# Patient Record
Sex: Female | Born: 1935 | Race: White | Hispanic: No | Marital: Single
Health system: Southern US, Community
[De-identification: ages and names within clinical notes are randomized; demographics above are authoritative.]

## PROBLEM LIST (undated history)

## (undated) DIAGNOSIS — M25551 Pain in right hip: Secondary | ICD-10-CM

## (undated) DIAGNOSIS — G5600 Carpal tunnel syndrome, unspecified upper limb: Secondary | ICD-10-CM

## (undated) DIAGNOSIS — I2789 Other specified pulmonary heart diseases: Secondary | ICD-10-CM

## (undated) DIAGNOSIS — M25559 Pain in unspecified hip: Secondary | ICD-10-CM

## (undated) DIAGNOSIS — R296 Repeated falls: Secondary | ICD-10-CM

## (undated) DIAGNOSIS — M5137 Other intervertebral disc degeneration, lumbosacral region: Secondary | ICD-10-CM

## (undated) DIAGNOSIS — W19XXXA Unspecified fall, initial encounter: Secondary | ICD-10-CM

---

## 2010-02-05 LAB — CELL COUNT, BODY FLUID
FLD MONO/MACROPHAGES: 10 %
FLD NEUTROPHILS: 90 %
FLUID RBC CT.: >100 /mm3
FLUID WBC COUNT: 4147 /mm3 — ABNORMAL HIGH (ref 0–5)
TOTAL CELLS COUNTED: 10

## 2010-02-05 MED ADMIN — lidocaine (XYLOCAINE) 10 mg/mL (1 %) injection 10 mL: SUBCUTANEOUS | @ 15:00:00 | NDC 00409427601

## 2010-02-19 LAB — CULTURE, ANAEROBIC: Culture result:: NO GROWTH

## 2010-02-19 LAB — CULTURE, WOUND W GRAM STAIN: Culture result:: NO GROWTH

## 2011-07-01 ENCOUNTER — Inpatient Hospital Stay: Payer: Self-pay | Admitting: Internal Medicine

## 2011-11-03 ENCOUNTER — Encounter

## 2011-11-09 ENCOUNTER — Encounter

## 2011-11-21 LAB — CBC WITH AUTOMATED DIFF
ABS. BASOPHILS: 0 10*3/uL (ref 0.0–0.1)
ABS. EOSINOPHILS: 0.1 10*3/uL (ref 0.0–0.4)
ABS. LYMPHOCYTES: 1.2 10*3/uL (ref 0.8–3.5)
ABS. MONOCYTES: 0.6 10*3/uL (ref 0.0–1.0)
ABS. NEUTROPHILS: 5.4 10*3/uL (ref 1.8–8.0)
BASOPHILS: 0 % (ref 0–1)
EOSINOPHILS: 2 % (ref 0–7)
HCT: 43 % (ref 35.0–47.0)
HGB: 13.8 g/dL (ref 11.5–16.0)
LYMPHOCYTES: 16 % (ref 12–49)
MCH: 31.5 PG (ref 26.0–34.0)
MCHC: 32.1 g/dL (ref 30.0–36.5)
MCV: 98.2 FL (ref 80.0–99.0)
MONOCYTES: 8 % (ref 5–13)
NEUTROPHILS: 74 % (ref 32–75)
PLATELET: 261 10*3/uL (ref 150–400)
RBC: 4.38 M/uL (ref 3.80–5.20)
RDW: 14.5 % (ref 11.5–14.5)
WBC: 7.3 10*3/uL (ref 3.6–11.0)

## 2011-11-21 LAB — METABOLIC PANEL, BASIC
Anion gap: 9 mmol/L (ref 5–15)
BUN/Creatinine ratio: 17 (ref 12–20)
BUN: 24 MG/DL — ABNORMAL HIGH (ref 6–20)
CO2: 27 MMOL/L (ref 21–32)
Calcium: 8.9 MG/DL (ref 8.5–10.1)
Chloride: 103 MMOL/L (ref 97–108)
Creatinine: 1.41 MG/DL — ABNORMAL HIGH (ref 0.45–1.15)
GFR est AA: 43 mL/min/{1.73_m2} — ABNORMAL LOW (ref >60)
GFR est non-AA: 36 mL/min/{1.73_m2} — ABNORMAL LOW (ref >60)
Glucose: 106 MG/DL — ABNORMAL HIGH (ref 65–100)
Potassium: 4.8 MMOL/L (ref 3.5–5.1)
Sodium: 139 MMOL/L (ref 136–145)

## 2011-11-21 LAB — PROTHROMBIN TIME + INR
INR: 1.7 — ABNORMAL HIGH (ref 0.9–1.1)
Prothrombin time: 17.6 s — ABNORMAL HIGH (ref 9.4–11.7)

## 2011-11-21 MED ORDER — ATROPINE 0.1 MG/ML SYRINGE
0.1 mg/mL | INTRAMUSCULAR | Status: DC | PRN
Start: 2011-11-21 — End: 2011-11-21

## 2011-11-21 MED ORDER — LIDOCAINE HCL 1 % (10 MG/ML) IJ SOLN
10 mg/mL (1 %) | Freq: Once | INTRAMUSCULAR | Status: DC | PRN
Start: 2011-11-21 — End: 2011-11-21
  Administered 2011-11-21: 14:00:00 via SUBCUTANEOUS

## 2011-11-21 MED ORDER — HEPARIN (PORCINE) IN NS (PF) 1,000 UNIT/500 ML IV
1000 unit/500 mL | INTRAVENOUS | Status: DC
Start: 2011-11-21 — End: 2011-11-21

## 2011-11-21 MED ORDER — MIDAZOLAM 1 MG/ML IJ SOLN
1 mg/mL | INTRAMUSCULAR | Status: DC | PRN
Start: 2011-11-21 — End: 2011-11-21
  Administered 2011-11-21 (×3): via INTRAVENOUS

## 2011-11-21 MED ORDER — FENTANYL CITRATE (PF) 50 MCG/ML IJ SOLN
50 mcg/mL | INTRAMUSCULAR | Status: DC | PRN
Start: 2011-11-21 — End: 2011-11-21
  Administered 2011-11-21 (×4): via INTRAVENOUS

## 2011-11-21 MED ORDER — IOVERSOL 350 MG/ML IV SOLN
350 mg iodine/mL | Freq: Once | INTRAVENOUS | Status: DC | PRN
Start: 2011-11-21 — End: 2011-11-21

## 2011-11-21 MED ADMIN — 0.9% sodium chloride infusion: INTRAVENOUS | @ 14:00:00 | NDC 00409798303

## 2011-11-21 MED FILL — LIDOCAINE HCL 1 % (10 MG/ML) IJ SOLN: 10 mg/mL (1 %) | INTRAMUSCULAR | Qty: 20

## 2011-11-21 MED FILL — SODIUM CHLORIDE 0.9 % IV: INTRAVENOUS | Qty: 1000

## 2011-11-21 MED FILL — FENTANYL CITRATE (PF) 50 MCG/ML IJ SOLN: 50 mcg/mL | INTRAMUSCULAR | Qty: 2

## 2011-11-21 MED FILL — IOVERSOL 350 MG/ML IV SOLN: 350 mg iodine/mL | INTRAVENOUS | Qty: 200

## 2011-11-21 MED FILL — MIDAZOLAM 1 MG/ML IJ SOLN: 1 mg/mL | INTRAMUSCULAR | Qty: 5

## 2011-11-21 MED FILL — ATROPINE 0.1 MG/ML SYRINGE: 0.1 mg/mL | INTRAMUSCULAR | Qty: 10

## 2011-11-21 NOTE — Progress Notes (Signed)
Cardiac Cath Lab Procedure Area Arrival Note:    Jenny [name] arrived to Cardiac Cath Lab, Procedure Area. Patient identifiers verified with NAME and DATE OF BIRTH. Procedure verified with patient. Consent forms verified. Allergies verified. Patient informed of procedure and plan of care. Questions answered with review. Patient voiced understanding of procedure and plan of care.    Patient on cardiac monitor, non-invasive blood pressure, SPO2 monitor. On   or O2 @ 2 lpm via NC.  IV of NS on pump at 100 ml/hr. Patient status doing well without problems. Patient is A&Ox 3. Patient reports back pain/uncomfortable on table 6/10.    Patient medicated during procedure with orders obtained and verified by Dr. Hanley Ben.    Refer to patients Cardiac Cath Lab PROCEDURE REPORT for vital signs, assessment, status, and response during procedure, printed at end of case. Printed report on chart or scanned into chart.

## 2011-11-21 NOTE — Progress Notes (Signed)
Discussed with the patient and all questioned fully answered. She will call me if any problems arise.

## 2011-11-21 NOTE — Progress Notes (Signed)
PA saturation 68.1

## 2011-11-21 NOTE — Progress Notes (Signed)
Jenny [name] ambulated @ 1350 (time) approximately 75 feet.    Patient tolerated ambulation without adverse advents.       (right, groin)  without bleeding or hematoma noted. # 7 fr sheath pulled from RV no hematoma or bleeding noted.

## 2011-11-21 NOTE — Other (Signed)
Cardiac Cath Lab Recovery Arrival Note:      Jenny [name] arrived to Cardiac Cath Lab, Recovery Area. Staff introduced to patient. Patient identifiers verified with NAME and DATE OF BIRTH. Procedure verified with patient. Consent forms reviewed and signed by patient or authorized representative and verified. Allergies verified.     Patient and family oriented to department. Patient and family informed of procedure and plan of care.     Questions answered with review. Patient prepped for procedure, per orders from physician, prior to arrival.    Patient on cardiac monitor, non-invasive blood pressure, SPO2 monitor. On RA. Patient is A&Ox 4. Patient reports no pain.     Patient in stretcher, in low position, with side rails up, call bell within reach, patient instructed to call if assistance as needed.    Patient prep in: CCL Recovery Area, [place] [address].   Patient family has pager #   Family in:    Prep by: V. Barry Dienes RN

## 2011-11-21 NOTE — Progress Notes (Signed)
PULMONARY/CRITICAL CARE/SLEEP MEDICINE          Name: Jenny [name]   DOB: 23-Jun-1929   MRN: [id number]   Date: 11/21/2011      Jenny [name] is a 78 year old female with history of worsening intolerance of physical activity at NYHA FC III. No overt cause has been found on PFTs, CXR, VQ scan, pacemaker checks and stress test. Echo had suggested findings of pulmonary hypertension and she presented to the cath lab for confirmatory right heart catheterization which was performed by Dr. Hanley Ben (Thanks!).     Cath showed mild PH but mean PAP fell to below 25 during testing and acute vasodilator trial was not felt indicated and not performed.     EXAMINATION:  VS 135/65, 88, 97%, 20/min  Chest: clear to auscultation  Cor: RRR, 2/6 systolic murmur at LUSB, loud S2  PA: soft non-tender  Ext: trace pedal edema    RHC DATA:    Baseline hemodynamic data: All values measured at end-expiration    PAP:46/16 mmHg  mPAP: 26 mmHg  mPAOP:a wave of 14, v wave of 20 mmHg  TDCO:3.4 L/min  Calculated CI: 1.84 L/mim/m-2  Calculated PVR: 282 Dynes.sec/cm-5  mRAP:11 mmHg  Distal PA sat:68 %  SVC sat: 65%      ASSESSMENT:    Mild pulmonary hypertension with reduced cardiac output  No evidence of left to right shunt   NYHA FC III- not explained by severity of PH      RECOMMENDATIONS:  6 MWT at our office for objective assessment of exercise capacity, perhaps CPET if she still remains symptomatic and seeks answers  Keep scheduled follow up with me in a few weeks  No changes made in medical regimen  Have suggested physical therapy to improve endurance    Thank you for allowing [place] to participate in this patient's care      Darra Lis, MD, [facility]  [facility]

## 2011-11-21 NOTE — Procedures (Signed)
Cardiac Catheterization Procedure Note   Patient: Jenny [name]  MRN: [id number]  SSN: [id number]   Date of Birth: 08/04/1928 Age: 78 y.o.  Sex: female    Date of Procedure: 11/21/2011   Pre-procedure Diagnosis: Shortness of Breath  Post-procedure Diagnosis: Pulmonary hypertension  Procedure: Right Heart Cath  Operator:  Dr. Quetzalli Clos H Ileene Allie, MD    Assistant(s):  None  Anesthesia: Moderate Sedation   Estimated Blood Loss: Less than 10 mL   Specimens Removed: None  Findings: Mild pulmonary hypertension  Complications: None   Implants:  None  Signed by:  Jamina Macbeth H Antwan Bribiesca, MD  11/21/2011  10:46 AM

## 2011-11-21 NOTE — H&P (Signed)
History and Physical from recent office visit of 11/02/2011 as below  There have been no interval changes    Darra Lis, MD  11/21/2011        Keitha Butte  11/01/2011 2:15 PM  Location: [facility]  Patient #: [id number]  DOB: 09-25-1928  Widowed / Language: [place] / Race: White  Female      History of Present Illness??(Darra Lis, MD; 11/02/2011 11:22 AM)  ??????????????????The patient is a 78 year old female who presents for evaluation of shortness of breath. The reason for today's visit is consultation. The shortness of breath has been occurring for years and the onset has been ??gradual. The symptoms are described as with exertion. The course is increasing There has been associated diaphoresis and edema - lower extremities. The symptoms are relieved by rest. Exacerbating factors include exercise.    Note: Patient has been referred by Dr. Liam Graham and Dr. Viviann Spare cross for evaluation of pulmonary hypertension. She reports she has had progressively worsening exertional breathlessness for the past 2 years, and particularly bothersome for the past 4-5 months. She reports minimal activity on level ground has become a struggle. She is diaphoretic during trying to accomplish routine activities. She also reports palpitations with exertion but denies anginal chest pain. She has had mild leg swelling for the past many months, more prominent on the left. She attributes this to poor circulation and has noticed discolored skin changes on her lower extremities. She denies bothersome cough, expectoration, pleuritic chest pain, claudication or fevers. She has not noticed wheezing.    She was evaluated by me in February of 2011 when she was referred by Dr. Jed Limerick for evaluation of exertional breathlessness and pulmonary hypertension identified on echocardiogram. She was recommended screening PFTs which were unremarkable and right heart cath was being contemplated. Her daughter-in-law subsequently suffered a lower extremity fracture  injury and she left for [place] and had to travel frequently to and fro during the course of the year. She was subsequently lost to followup until she was seen by Dr. Liam Graham for evaluation of shortness of breath in March of this year and has found her way back to me for work of pulmonary hypertension.    She has underlying history of organic heart disease and is followed by Dr. Jed Limerick on a regular basis. She has underlying paroxysmal atrial fibrillation and sick sinus syndrome along with a history of paroxysmal supraventricular tachycardia and underwent radiofrequency ablation of atrial tachycardia side in 2007 and a dual-chamber permanent pacemaker after ablation of AV node in December of 2008. She has had aortic sclerosis without significant stenosis. She has had stress tests that did not show reversible ischemia, her last stress test was in March of 2013 which showed that left ventricle was normal in size and left ventricle ejection fraction was estimated at 59%. Stress test mentioned moderate enlargement of right ventricle. Previous echocardiograms have shown right-sided pressures estimated between 50-60 mmHg but without right heart dilatation. She is followed in the pacemaker clinic at [facility] and does not recall being told of problems on pacemaker check.    She had a fall while playing with her grandson in December of 2012 and fractured some ribs. Followup chest x-ray that is currently unavailable to me for personal review showed left lower lung zone density, this was reviewed by Dr. Liam Graham and though to be a post fall effusion. Previous chest x-ray available in the system for review from 2009 showed clear lung fields without evidence of interstitial  lung disease or other airspace disease. She is a lifelong nonsmoker. PFTs done in the office today were preserved with normal spirometry, normal lung volumes and normal diffusion capacity. Airway resistance was mildly increased.    Patient appears to be  significantly limited functionally and is at atleast NYHA functional class III. She symptomatic with minimal activity and no clear-cut explanation has been found. Pulmonary function tests and chest x-ray appear to be relatively unremarkable making it unlikely that there is a significant pulmonary contribution to her symptoms. Recent stress test was negative for ischemia and her arrhythmia has been treated and presumably an inactive problem. She has had findings of pulmonary hypertension and now has a dilated right heart. She is interested in pursuing further workup and evaluation of pulmonary hypertension and explore options for treatment to improve functional capacity. Right heart catheterization was discussed with her and she is agreeable. This will be scheduled for her at [facility] hospital later this month. Lab data, repeat chest x-ray and a VQ scan will be obtained and reviewed prior to cath.    ??  Problem List/Past Medical??  AORTIC SCLEROSIS (440.0).?? not likely to be a significant factor in the patient's complaints of dyspnea per cardiology reports.  ABNORMAL CHEST X-RAY (793.19).?? Likely secondary to recent fall.?? Followup chest x-ray on return.  DYSPNEA ON EXERTION (786.09).?? Worsening over the past several months.?? Unclear etiology.?? Likely multifactorial.?? Pulmonary artery pressures appear to be stable from the 2010 2012 echocardiogram.?? She does have abnormal findings on the recent chest x-ray, but by history, her dyspnea has worsened since?? Summer 2012 and her fall occurred late 2012.?? Pulmonary function tests are largely unremarkable from a couple of years ago.?? The chest x-ray will be repeated, and?? if clear, repeat pulmonary function tests can be  undertaken.?? room air saturation with activity, 95%.?? The patient,?? however, complained of dyspnea and needed to stop.?? She walked about 60 yards.  PULMONARY HYPERTENSION (416.8).?? The patient will followup with Dr.Briani Maul next vailable.  Thyroid Disease   Diverticulitis Of Colon  Colon/Intestinal problems  Fracture  Fibromyalgia  Arthritis  Abnormal chest x-ray  Cataract  Bladder disease  Osteoarthritis  Kidney Stone  Rheumatic Fever  Pleurisy  Heart valve disorder  Heart Disease  Kidney Disease  High blood pressure    ??  Allergies??(Bryan Lemma; 11/01/2011 2:15 PM)  Morphine Derivatives  Penicillins  Percocet *ANALGESICS - OPIOID*    ??  Family History??(Bryan Lemma; 11/01/2011 2:15 PM)  Osteoarthritis.?? Mother.  Blood clot(s) in leg(s).?? Mother.  Arthritis.?? Mother.    ??  Social History??(Bryan Lemma; 11/01/2011 2:15 PM)  Non smoker / no tobacco use.?? Never smoker.  Marital status.?? Widowed.  Traveled outside the [place] in past 10 years  Alcohol Use.?? Occasional alcohol use.  Pets/Animals in home    ??  Medication History??(Bryan Lemma; 11/01/2011 2:19 PM)  Cymbalta (60MG  Capsule DR Part, 1 Oral daily) Active.  Bystolic (5MG  Tablet, 1 Oral daily) Active.  Warfarin Sodium ( Oral as directed) Active.  Spironolactone (25MG  Tablet, 1 Oral daily) Active.  Levothyroxine Sodium ( Tablet, 1 Oral daily) Active.  Medications Reconciled.    ??  Other Problems??(Darra Lis, MD; 11/02/2011 11:10 AM)  Unspecified Diagnosis    ??  Review of Systems??(Bryan Lemma; 11/01/2011 2:19 PM)  General:??Present- Night Sweats??and Weight Gain. Not Present- Fever, Weight Loss, Medication allergies, Seasonal allergies and Food allergies.  Skin:??Not Present- Rash, Jaundice and Cyanosis.  HEENT:??Present- Ringing in the Ears??and Hoarseness. Not Present- Blurred Vision, Double Vision, [place]  discharge, Wears glasses/contact lenses, Deafness, Ear Discharge, Ear Pain, Nose Bleed, Nasal obstruction, Sinusitis, Difficulty swallowing and Sore Throat.  Respiratory:??Present- Daytime sleepiness??and Shortness of breath. Not Present- Coughing blood, Cough, Snoring and Wheezing.  Cardiovascular:??Present- Palpitations. Not Present- Chest Pain and Swelling of Extremities.  Gastrointestinal:??Not Present-  Abdominal Pain, Constipation, Diarrhea, Indigestion, Nausea, Rectal Bleeding and Vomiting.  Female Genitourinary:??Present- Incontinence??and Painful Urination. Not Present- Blood in Urine, Frequency and Urinating at Night.  Musculoskeletal:??Present- Joint Pain, Joint Stiffness??and Joint Swelling.  Neurological:??Present- Headaches, Numbness??and Weakness.  Psychiatric:??Not Present- Anxiety, Depression and Hallucinations.  Endocrine:??Present- Excessive Thirst. Not Present- Cold Intolerance and Heat Intolerance.  Hematology:??Present- Easy Bruising. Not Present- Abnormal Bleeding and Swollen glands.    ??  Vitals??(Bryan Lemma; 11/01/2011 2:18 PM)  11/01/2011 2:16 PM  ??Weight: 167 lb ??Height: 64 in  ??Body Surface Area: 1.85 m?? ??Body Mass Index: 28.67 kg/m??  ??Temp.: 97.6?? F (Oral)?? ??Pulse: 70 (Regular)?? ??P.OX: 96% (Room air)  ??BP: 116/80?????? (Sitting, Left Arm, Standard)      ??  Physical Exam??(Seirra Kos, MD; 11/02/2011 11:06 AM)  The physical exam findings are as follows:    ??  Chest and Lung Exam??  Constitutional??- alert, cooperative, well nourished and no acute distress. Skin??- normal moisture, normal warmth and chronic stasis changes. Head and Neck??- normocephalic, atraumatic, trachea midline and thyroid normal. Eyes??- pupils equal round and reactive to light, conjunctiva normal and lids normal. ENMT??- external ears have normal appearance, external nose is normal, oral mucosa is moist and oropharynx clear.  Chest and Lungs:??Auscultation??- breath sounds normal, no wheezes, no rhonchi and no rales. Palpation??- chest wall non-tender. Inspection??- normal shape and normal respiratory effort.  Cardiovascular??- Auscultation??- rate regular (accentuated P2) and systolic ejection murmur at left sternal border. Inspection??- jugular veins nondistended, +1 pitting edema present and pacemaker present. Abdomen??- normal contour, nontender, soft and no hepatosplenomegaly. Peripheral Vascular??- normal gait and no digital clubbing.  Neuropsychiatric??- oriented x 3, fluent, appropriate mood and affect and good insight. Lymphatic??- no neck lymphadenopathy and no supraclavicular lymphadenopathy. Neurological??- extraocular muscles intact. DATA??- Chest x-ray report and films reviewed, PFT results reviewed and Echocardiogram report reviewed.      Assessment & Plan??(Darra Lis, MD; 11/02/2011 11:16 AM)    PULMONARY HYPERTENSION (416.8)  Story: Etiology undetermined  Impression: She is currently anticoagulated but will screen for previous PE with VQ scan. RHC with vasodilator study planned as above  Current Plans  ????l????  CHEST X-RAY PA/LAT ([address])  ????l????  LUNG V/Q IMAGING (60454) (Rule out previous PE)  ????l????  RIGHT HEART CATHETERIZATION: RIGHT HEART CATH [phone]) ([facility]: Dr. Hanley Ben. Week of May 20th)  ????l????  EXERCISE OXIMETRY (94761) (6 MWT on RA, use O2 if needed to keep sats > 88%)  ????l????  Follow up - Make appt after diagnostic tests  ??  DYSPNEA ON EXERTION (786.09)  Story: Etiology undetermined.  PFTs 10/2011: FVC 85%, FEV1 86%, TLC 83%, DLCO 80%, Raw 332  ??  PAROXYSMAL ATRIAL FIBRILLATION (427.31)  Story: Pacemaker in place, previous ablation. She reports pacemaker checks have been OK  ??  CHRONIC ANTICOAGULATION (V58.61)  ??  PERIPHERAL VASCULAR DISEASE (443.9)  Story: May need workup  ??  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  Note: Please request Echocardiogram report from Dr. Jed Limerick' office and Lab data from PCP  I will see you in cath lab on the day of the procedure to discuss findings and treatment options    ??  Medical Decision  Making??(Darra Lis, MD; 11/02/2011 11:15 AM)  Amount/complexity of data to be reviewed:  - Order and/or review of radiology test(s)  - Order and/or review of diagnostic test(s)  - Independent visualization of diagnostic test  - Independent visualization of radiology test  - Review and summarization of old records    ??  ????  ??????Signed electronically by  Darra Lis, MD (11/02/2011 11:23 AM)

## 2011-11-21 NOTE — Progress Notes (Signed)
SHEATH PULL NOTE:    Patient informed of procedure with questions answered with review. Sheath site prepped with Chloraprep swab. 7 fr sheath in RV pulled by V. Barry Dienes ,RN. Hand hold and syvek patch applied, with manual compression to site. No bleeding, no hematoma, no pain at site. Hemostasis obtained with hand hold/manual compression at site. Patient tolerated well. No change in status. Handhold for 15 minutes. No change at site. Tegederm dressing applied to site. No bleeding, no hematoma, no pain/discomfort at site. Groin instructions provided with review. Continue to monitor procedure site and patient status.    *Advised patient to keep head flat and extremity flat to decrease risk of bleeding.    *Recommended that patient not drink for ONE HOUR post sheath pull completion.    *Recommended that patient not eat for TWO HOURS post sheath pull completion.    *Instructed patient on rationale for delay of PO products to decrease risk for aspiration and if additional treatment to procedure site is required. Patient verbalized understanding of instructions with review.

## 2011-11-21 NOTE — Progress Notes (Signed)
TRANSFER - IN REPORT:    Verbal report received from [facility] Lopez RN on CHEALSEA PASKE  being received from cath lab procedure area  for routine progression of care. Report consisted of patient???s Situation, Background, Assessment and Recommendations(SBAR). Information from the following report(s) Kardex and Procedure Summary was reviewed with the receiving clinician. Opportunity for questions and clarification was provided. Assessment completed upon patient???s arrival to Cardiac Cath Lab RECOVERY AREA and care assumed.       Cardiac Cath Lab Recovery Arrival Note:    CHELSI WARR arrived to CCL recovery area.  Patient procedure= RHC. Patient on cardiac monitor, non-invasive blood pressure, SPO2 monitor. On  O2 @ 2 lpm via NC.  IV  of NS on pump at 25 ml/hr. Patient status doing well without problems. Patient is A&Ox 3. Patient reports no pain.    PROCEDURE SITE CHECK:    Procedure site:without any bleeding and no hemmatoma, No pain/discomfort reported at procedure site.     No change in patient status. Continue to monitor patient and status.

## 2011-11-21 NOTE — Procedures (Signed)
Cardiac Catheterization Procedure Note   Patient: Jenny [name]  MRN: [id number]  SSN: [id number]   Date of Birth: [birth year] Age: 78 y.o.  Sex: female    Date of Procedure: 11/21/2011   Pre-procedure Diagnosis: Shortness of Breath  Post-procedure Diagnosis: Pulmonary hypertension  Procedure: Right Heart Cath  Operator:  Dr. Arrie Senate, MD    Assistant(s):  None  Anesthesia: Moderate Sedation   Estimated Blood Loss: Less than 10 mL   Specimens Removed: None  Findings: Mild pulmonary hypertension  Complications: None   Implants:  None  Signed by:  Arrie Senate, MD  11/21/2011  10:46 AM

## 2011-11-22 NOTE — Procedures (Signed)
Sondra Barges [facility]                                [address]                              [place], [place] [address]      Name:      ARYN, SAFRAN             Service Date:   11/21/2011  DOB:       [birth year]                     Ordered by:                                            Location:  Sex:       F                              Age:            78  Billing #: [id number]                   Date of Adm:    11/21/2011                               CORONARY ANGIOGRAPHY        rptText  STUDY/CINE: 47,829  TECHNIQUE: *@@*    PROCEDURE: Right heart catheterization.    TECHNIQUE:  Judkins.    CATHETERS: A 6 sheath, a 7 sheath, 6 thermodilution Swan-Ganz, 7  thermodilution Swan-Ganz, 0.025 J-wire x3.    MEDICINES  1.  Versed 5 mg IV.  2.  Fentanyl 100 mcg IV.  3.  Lidocaine 1%, 10 mL.    OXYGEN SATURATIONS: Pulmonary artery 68%, superior vena cava 65.8%, aorta  94%.    PRESSURE DATA: Right atrial mean 11, right ventricle 47/0-8, pulmonary  artery 47/13 with a mean of 29, pulmonary capillary wedge mean of 14.    COMMENT: The patient underwent right heart catheterization which was  technically very difficult.  I was unable to advance a 6 Swan-Ganz catheter  into the pulmonary artery shifting to a 7-French system with  difficulty.  Eventually a wire was placed in the pulmonary artery and a  catheter advanced slowly over it at which time no further  problems.  Thermodilution cardiac output was 3.4 L/min with an index of  1.84 L/min per meters squared. While preparing to do nitric oxide  inhalation for mild pulmonary hypertension, pulmonary artery mean pressure  dropped from the high 20s to about 18, and it was felt that there would be  no value in nitric oxide inhalation, and Dr. Fraser Din canceled this portion of  the procedure.    INDICATIONS: The patient is an 78 year old white female with an indwelling  dual-chamber pacemaker, known mild sclerosis of the aortic valve and a  history of  mild pulmonary hypertension. She has had progressive  breathlessness, particularly in the last 4-5 months, although this has been  going on for about 2 years and with prior echo suggesting mild to moderate  pulmonary  hypertension and recent nuclear stress testing suggesting normal  left ventricular function and no ischemic heart disease. She was referred  for further evaluation of suspected pulmonary hypertension.    IMPRESSION  1.  Mild pulmonary hypertension.  2.  Reduced resting cardiac output.    COMPLICATIONS: None.            Arrie Senate, MD    cc:    Inez Catalina, MD         Arrie Senate, MD         Tito Dine, MD         Kerby Less, MD  DR. SHILPA JOHRI    GHD/wmx; D:  11/21/2011 09:56 P; T:  11/22/2011 02:23 P; DOC# [id number]; JOB#  [id number]

## 2011-11-22 NOTE — Procedures (Signed)
[facility]                                [address]                              [place] [address]      Name:      Jenny [name], Jenny [name]             Service Date:   11/21/2011  DOB:       11/02/1928                     Ordered by:                                            Location:  Sex:       F                              Age:            78  Billing #: [id number]                   Date of Adm:    11/21/2011                               CORONARY ANGIOGRAPHY        rptText  STUDY/CINE: 66,512  TECHNIQUE: *@@*    PROCEDURE: Right heart catheterization.    TECHNIQUE:  Judkins.    CATHETERS: A 6 sheath, a 7 sheath, 6 thermodilution Swan-Ganz, 7  thermodilution Swan-Ganz, 0.025 J-wire x3.    MEDICINES  1.  Versed 5 mg IV.  2.  Fentanyl 100 mcg IV.  3.  Lidocaine 1%, 10 mL.    OXYGEN SATURATIONS: Pulmonary artery 68%, superior vena cava 65.8%, aorta  94%.    PRESSURE DATA: Right atrial mean 11, right ventricle 47/0-8, pulmonary  artery 47/13 with a mean of 29, pulmonary capillary wedge mean of 14.    COMMENT: The patient underwent right heart catheterization which was  technically very difficult.  I was unable to advance a 6 Swan-Ganz catheter  into the pulmonary artery shifting to a 7-French system with  difficulty.  Eventually a wire was placed in the pulmonary artery and a  catheter advanced slowly over it at which time no further  problems.  Thermodilution cardiac output was 3.4 L/min with an index of  1.84 L/min per meters squared. While preparing to do nitric oxide  inhalation for mild pulmonary hypertension, pulmonary artery mean pressure  dropped from the high 20s to about 18, and it was felt that there would be  no value in nitric oxide inhalation, and Dr. Johri canceled this portion of  the procedure.    INDICATIONS: The patient is an 78-year-old white female with an indwelling  dual-chamber pacemaker, known mild sclerosis of the aortic valve and a  history of  mild pulmonary hypertension. She has had progressive  breathlessness, particularly in the last 4-5 months, although this has been  going on for about 2 years and with prior echo suggesting mild to moderate  pulmonary   hypertension and recent nuclear stress testing suggesting normal  left ventricular function and no ischemic heart disease. She was referred  for further evaluation of suspected pulmonary hypertension.    IMPRESSION  1.  Mild pulmonary hypertension.  2.  Reduced resting cardiac output.    COMPLICATIONS: None.            Linn Goetze H Malicia Blasdel, MD    cc:    Steven Wayne Cross, MD         Jamara Vary H Eurydice Calixto, MD         Michael A Mistretta, MD         Katherine L Smallwood, MD  DR. SHILPA JOHRI    GHD/wmx; D:  11/21/2011 09:56 P; T:  11/22/2011 02:23 P; DOC# [id number]; JOB#  [id number]

## 2012-08-12 IMAGING — CT CT CERVICAL SPINE WITHOUT CONTRAST
1 series · 12 of 14 positions shown, 15 images · non-contrast
Comparison: None

REASON FOR EXAM: pain  in neck after fall
COMMENTS:

PROCEDURE:     CT  - CT CERVICAL SPINE WO  - July 02, 2011  [DATE]
RESULT:     Clinical Indication: Neck pain
TECHNIQUE: Multiple axial CT images from the skull base to the mid vertebral
body of T1. obtained with sagittal and coronal reformatted images provided.

[Series 6: axial · axial · 0.36mm/px · z∈[+95,+227]mm · 12 of 80 slices shown, 15 images]
[im 7/80  soft-tissue]
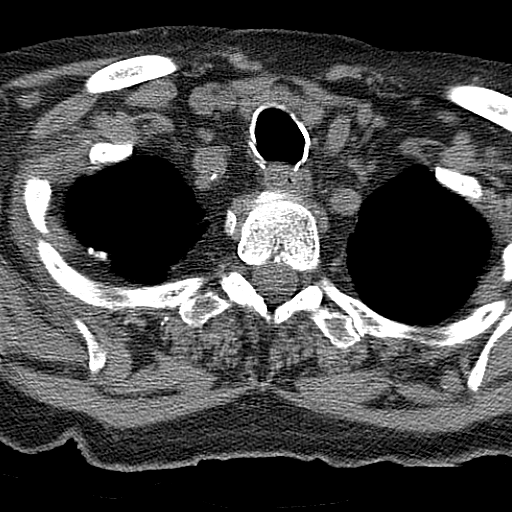
[im 7/80  bone]
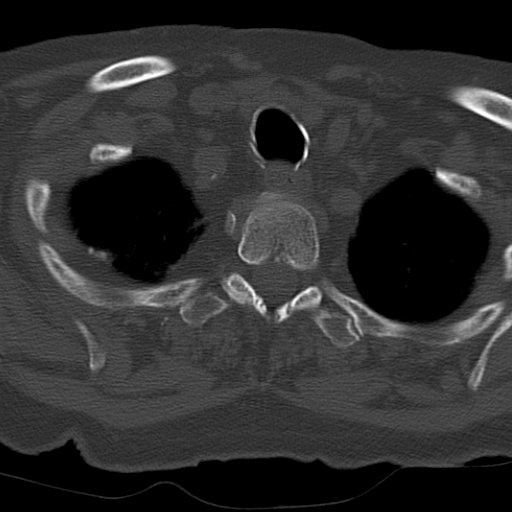
[im 13/80  bone]
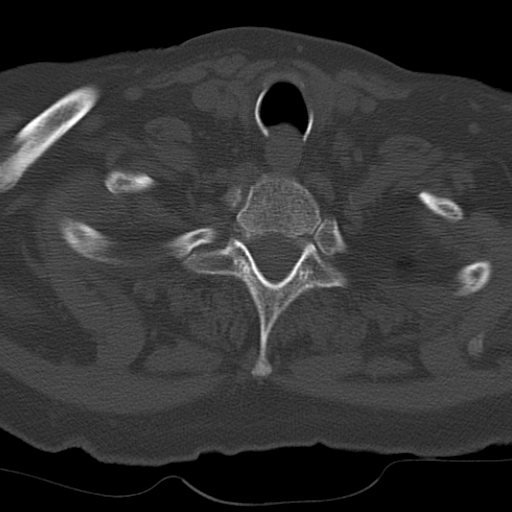
[im 19/80  bone]
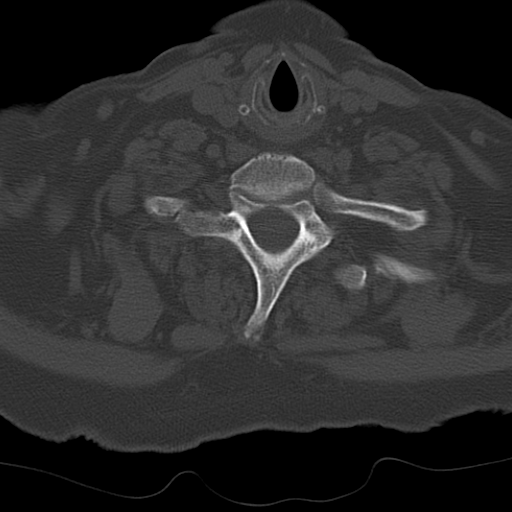
[im 25/80  bone]
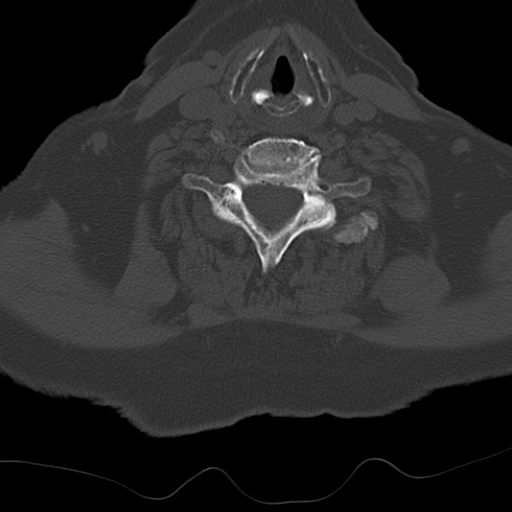
[im 31/80  soft-tissue]
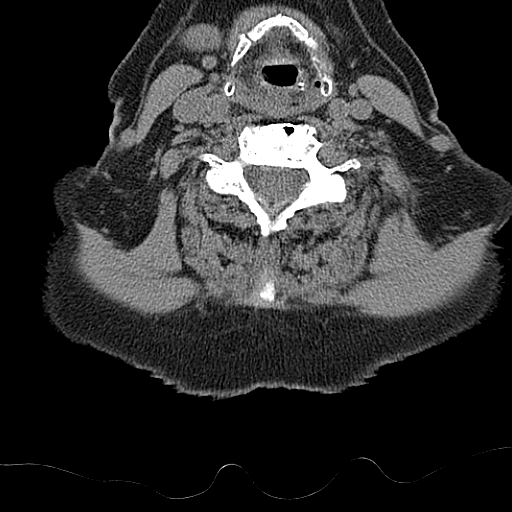
[im 31/80  bone]
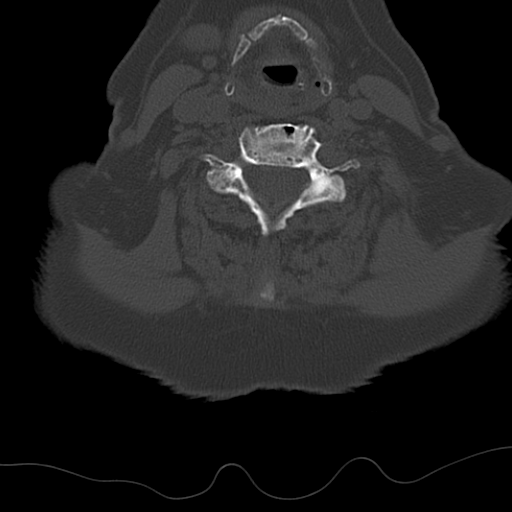
[im 37/80  bone]
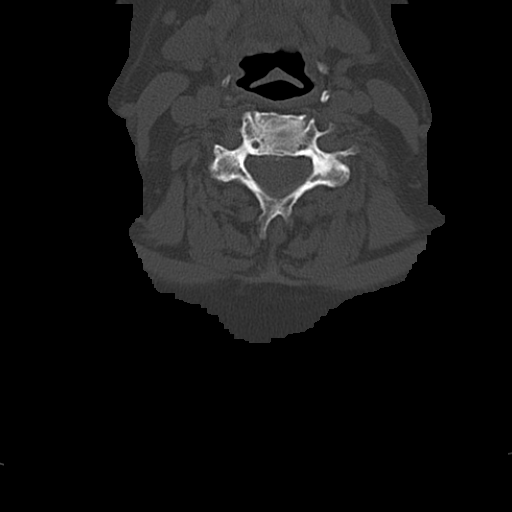
[im 43/80  bone]
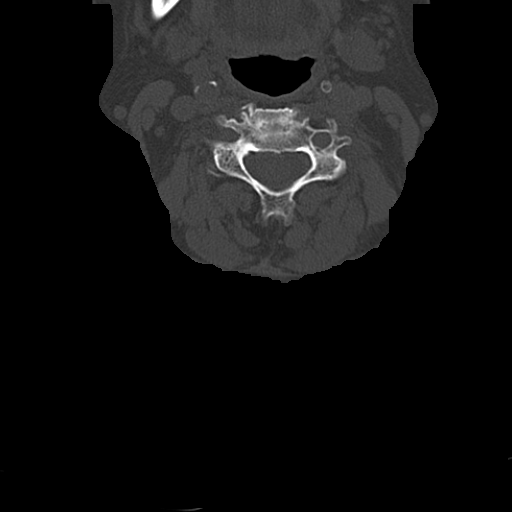
[im 49/80  bone]
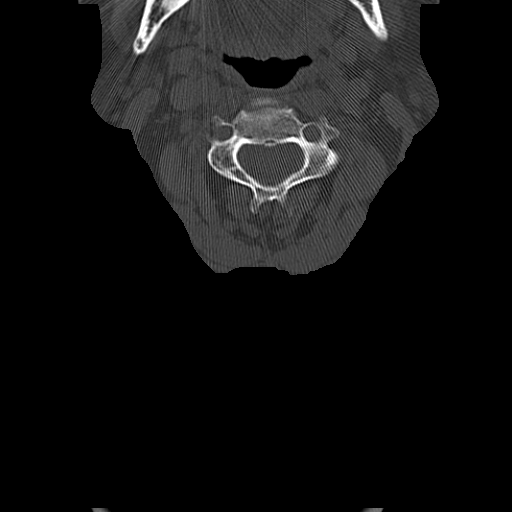
[im 55/80  soft-tissue]
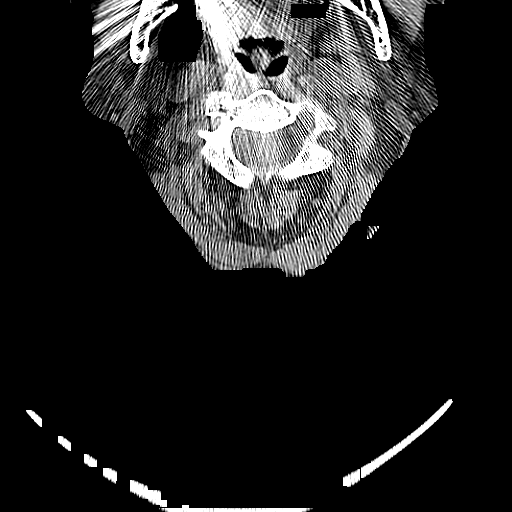
[im 55/80  bone]
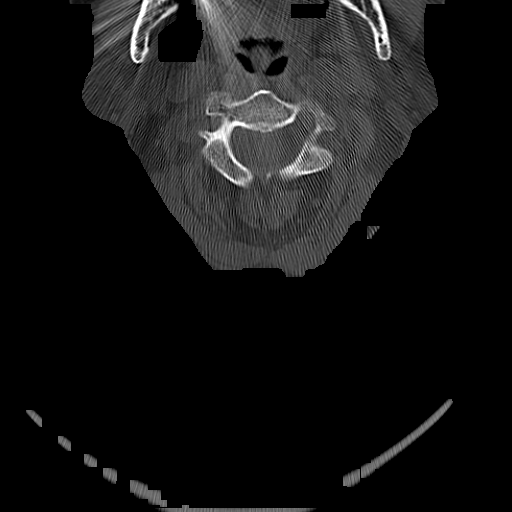
[im 61/80  bone]
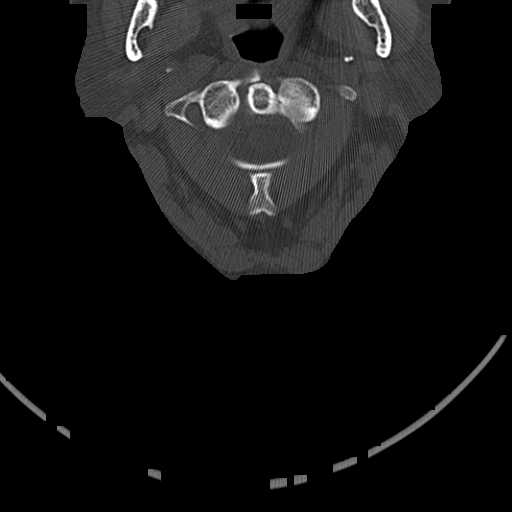
[im 67/80  bone]
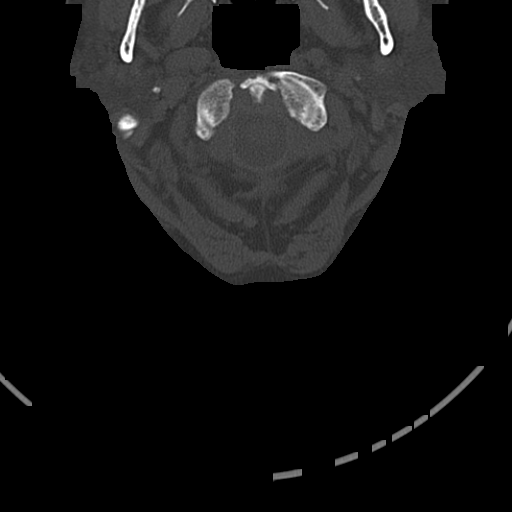
[im 73/80  bone]
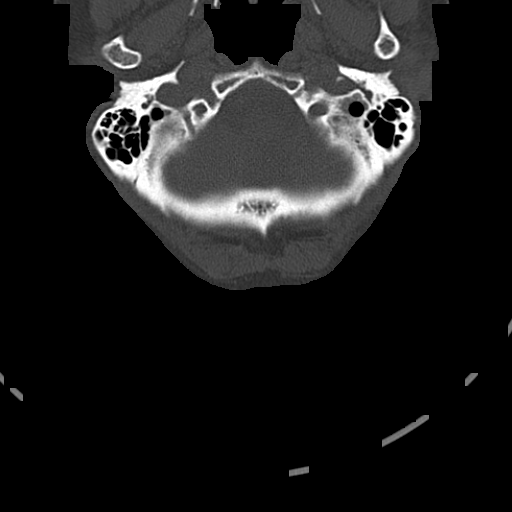

[12 of 14 positions shown; findings below may reference images not displayed]

FINDINGS: The alignment is anatomic. The vertebral body heights are maintained. There
is no acute fracture or static listhesis. The prevertebral soft tissues are
normal. The intraspinal soft tissues are not fully imaged on this
examination due to poor soft tissue contrast, but there is no soft tissue
gross abnormality.

There is degenerative disc disease at C3-C4, C4-C5, C5-C6 and C6-C7 with
disc base narrowing and discogenic endplate osteophytes. There are
broad-based discussed for complexes C3-C4, C4-C5 and C5-C6. There is a
broad-based disc bulge at T2-T3.

The visualized portions of the lung apices demonstrate no focal abnormality.
IMPRESSION: 1. No acute osseous injury of the cervical spine.

2. Ligamentous injury is not evaluated. If there is high clinical concern
for ligamentous injury, consider MRI or flexion/extension radiographs as
clinically indicated and tolerated.

## 2012-08-28 LAB — METABOLIC PANEL, BASIC
Anion gap: 10 mmol/L (ref 5–15)
BUN/Creatinine ratio: 18 (ref 12–20)
BUN: 28 MG/DL — ABNORMAL HIGH (ref 6–20)
CO2: 29 MMOL/L (ref 21–32)
Calcium: 9.4 MG/DL (ref 8.5–10.1)
Chloride: 103 MMOL/L (ref 97–108)
Creatinine: 1.53 MG/DL — ABNORMAL HIGH (ref 0.45–1.15)
GFR est AA: 39 mL/min/{1.73_m2} — ABNORMAL LOW (ref >60)
GFR est non-AA: 32 mL/min/{1.73_m2} — ABNORMAL LOW (ref >60)
Glucose: 92 MG/DL (ref 65–100)
Potassium: 5.8 MMOL/L — ABNORMAL HIGH (ref 3.5–5.1)
Sodium: 142 MMOL/L (ref 136–145)

## 2012-08-28 LAB — EKG, 12 LEAD, INITIAL
Atrial Rate: 66 {beats}/min
Calculated R Axis: -73 degrees
Calculated T Axis: -28 degrees
Q-T Interval: 468 ms
QRS Duration: 140 ms
QTC Calculation (Bezet): 486 ms
Ventricular Rate: 65 {beats}/min

## 2012-08-28 NOTE — Other (Signed)
[facility]  PREOPERATIVE INSTRUCTIONS    Surgery Date:   Friday,  August 31, 2012.  Surgery arrival time given by surgeon: NO     If ???no???,[facility] staff will call you between 4 PM- 8 PM the day before surgery with    your arrival time. If your surgery is on a Monday, we will call you the preceding Friday.       Please call [phone] after 8 PM if you did not receive your arrival time. Verification phone call on Thursday, August 30, 2012.    1. Please report at the designated time to the 2nd Floor Admitting Desk.   2. You must have a responsible adult to drive you home. You need to have a responsible adult to stay with you the first 24 hours after surgery if you are going home the same day of your surgery and you should not drive a car for 24 hours following your surgery.  3. Nothing to eat or drink after midnight the night before surgery. This includes no water, gum, mints, coffee, juice, etc.  Please note special instructions, if applicable, below for medications.  4. MEDICATIONS TO TAKE THE MORNING OF SURGERY WITH A SIP OF WATER: levothyroxine, spironolactone, lisinopril and gabapentin.  May TAKE hydrocodone if needed for pain.  5. No alcoholic beverages 24 hours before or after your surgery.  6. If you are being admitted to the hospital,please leave personal belongings/luggage in your car until you have an assigned hospital room number.  7. Stop Aspirin and/or any non-steroidal anti-inflammatory drugs (i.e. Ibuprofen, Naproxen, Advil, Aleve) as directed by your surgeon.  You may take Tylenol.        Stop herbal supplements 1 week prior to  surgery.  8. If you are currently taking Plavix, Coumadin,or any other blood-thinning/anticoagulant medication contact your surgeon for instructions.  9. Please wear comfortable clothes. Wear your glasses instead of contacts. We ask that all money, jewelry and valuables be left at home. Wear no make up, particularly mascara, the day of surgery.   10.  All body piercings, rings,and  jewelry need to be removed and left at home.    Please wear your hair loose or down. Please no pony-tails, buns, or any metal hair accessories. If you shower the morning of surgery, please do not apply any lotions, powders, or deodorants afterwards.   Do not shave any body area within 24 hours of your surgery.  11. Please follow all instructions to avoid any potential surgical cancellation.  12.  Should your physical condition change, (i.e. fever, cold, flu, etc.) please notify your surgeon as soon as possible.  13. It is important to be on time. If a situation occurs where you may be delayed, please call:  [phone] / [phone] on the day of surgery.  14. The Preadmission Testing staff can be reached at [phone]..  15. Special instructions: None.    The patient was contacted  in person.   She  verbalize  understanding of all instructions does not  need reinforcement.

## 2012-08-28 NOTE — Progress Notes (Addendum)
Faxed coumadin treatment plan to Dr. Westley Chandler office and Dr. Ashok Cordia  (cardiologist) for completion.  Pt stated she wasn't aware that she had to stop it.  Faxed BMP to dr. Westley Chandler office for review.  Spoke with Dr. Carren Rang about BMP results and pt needs medical clearance for kidney function prior to surgery on 08/31/12.      LM for Beth at Dr. Westley Chandler office about the need for medical clearance prior to surgery due to BMP results re: kidney function. Ancef not entered into CC due to contraindication.  Noted on front of chart.      Faxed BMP results to PCP Dr. Murrell Redden..    1500 Received coumadin plan from Dr. Ashok Cordia office. Requested last OV note .     1530 Beth at Dr.Bogle's office notified patient of coumadin instructions. Beth will notify Dr. Earley Favor of need for medical clearance. For patient.

## 2012-08-29 LAB — CULTURE, MRSA

## 2012-08-31 ENCOUNTER — Inpatient Hospital Stay: Payer: MEDICARE

## 2012-08-31 LAB — POC INR: INR (POC): 3.7 — ABNORMAL HIGH (ref <1.2)

## 2012-08-31 LAB — PROTHROMBIN TIME + INR
INR: 3.1 — ABNORMAL HIGH (ref 0.9–1.1)
Prothrombin time: 31.5 s — ABNORMAL HIGH (ref 9.4–11.7)

## 2012-08-31 MED ADMIN — lactated ringers infusion: INTRAVENOUS | @ 16:00:00 | NDC 00409795309

## 2012-08-31 MED FILL — LACTATED RINGERS IV: INTRAVENOUS | Qty: 1000

## 2012-08-31 MED FILL — CEFAZOLIN 2 GRAM/50 ML NS IVPB: INTRAVENOUS | Qty: 50

## 2012-08-31 MED FILL — MIDAZOLAM 1 MG/ML IJ SOLN: 1 mg/mL | INTRAMUSCULAR | Qty: 2

## 2012-08-31 MED FILL — FENTANYL CITRATE (PF) 50 MCG/ML IJ SOLN: 50 mcg/mL | INTRAMUSCULAR | Qty: 2

## 2012-08-31 NOTE — Other (Signed)
OR desk called to inform us that Dr. Dwyane Luo is going to cancel case.

## 2012-08-31 NOTE — Other (Signed)
Instructed Patient to call cardiologist with the reschedule date of surgery and the INR results from today for coumadin treatment for now and when to stop prior to surgery.

## 2012-08-31 NOTE — Other (Signed)
Dr. Dwyane Luo in to see patient, cancelled surgery and instructions given to re-schedule with his office for next week

## 2012-08-31 NOTE — Other (Signed)
ISTAT INR done with results of 3.7, Dr. Merlinda Frederick notifiedof results.  Will also notify Dr. Dwyane Luo.

## 2012-08-31 NOTE — Anesthesia Pre-Procedure Evaluation (Addendum)
Anesthetic History   No history of anesthetic complications           Review of Systems / Medical History  Patient summary reviewed, nursing notes reviewed and pertinent labs reviewed    Pulmonary  Within defined limits               Neuro/Psych   Within defined limits           Cardiovascular    Hypertension        Pacemaker (sick sinus syndrome) and dysrhythmias (afib, sp av nodal ablation)         GI/Hepatic/Renal                  Endo/Other      Hypothyroidism       Other Findings            Physical Exam    Airway  Mallampati: III  TM Distance: 4 - 6 cm  Neck ROM: normal range of motion   Mouth opening: Normal     Cardiovascular    Rhythm: regular  Rate: normal         Dental    Dentition: Lower dentition intact and Upper dentition intact     Pulmonary  Breath sounds clear to auscultation               Abdominal  GI exam deferred       Other Findings            Anesthetic Plan    ASA: 3  Anesthesia type: MAC - supraclavicular block          Induction: Intravenous  Anesthetic plan and risks discussed with: Patient      INR elevated > 3 this morning by istat, sending blood to lab for verification prior to proceeding

## 2012-09-03 ENCOUNTER — Inpatient Hospital Stay: Payer: MEDICARE

## 2012-09-03 LAB — PROTHROMBIN TIME + INR
INR: 1.5 — ABNORMAL HIGH (ref 0.9–1.1)
Prothrombin time: 14.5 s — ABNORMAL HIGH (ref 9.4–11.7)

## 2012-09-03 MED ADMIN — lidocaine (PF) (XYLOCAINE) 20 mg/mL (2 %) injection: INTRAVENOUS | @ 17:00:00 | NDC 63323020805

## 2012-09-03 MED ADMIN — lidocaine-EPINEPHrine (XYLOCAINE) 1 %-1:100,000 injection: @ 18:00:00 | NDC 00409317801

## 2012-09-03 MED ADMIN — bupivacaine (PF) (MARCAINE) 0.5 % (5 mg/mL) injection: @ 18:00:00 | NDC 00409116202

## 2012-09-03 MED ADMIN — fentaNYL citrate (PF) injection: INTRAVENOUS | @ 17:00:00 | NDC 00409909422

## 2012-09-03 MED ADMIN — lactated ringers infusion: INTRAVENOUS | @ 17:00:00 | NDC 00338011704

## 2012-09-03 MED ADMIN — ceFAZolin in 0.9% NS (ANCEF) IVPB soln 2 g: INTRAVENOUS | @ 17:00:00 | NDC 09999966850

## 2012-09-03 MED ADMIN — propofol (DIPRIVAN) 10 mg/mL injection: INTRAVENOUS | @ 18:00:00 | NDC 63323026920

## 2012-09-03 MED ADMIN — midazolam (VERSED) injection: INTRAVENOUS | @ 17:00:00 | NDC 24200056244

## 2012-09-03 MED FILL — LACTATED RINGERS IV: INTRAVENOUS | Qty: 1000

## 2012-09-03 MED FILL — MIDAZOLAM 1 MG/ML IJ SOLN: 1 mg/mL | INTRAMUSCULAR | Qty: 2

## 2012-09-03 MED FILL — BUPIVACAINE (PF) 0.5 % (5 MG/ML) IJ SOLN: 0.5 % (5 mg/mL) | INTRAMUSCULAR | Qty: 30

## 2012-09-03 MED FILL — LIDOCAINE-EPINEPHRINE 1 %-1:100,000 IJ SOLN: 1 %-:00,000 | INTRAMUSCULAR | Qty: 20

## 2012-09-03 MED FILL — CEFAZOLIN 2 GRAM/50 ML NS IVPB: INTRAVENOUS | Qty: 50

## 2012-09-03 MED FILL — LIDOCAINE HCL 1 % (10 MG/ML) IJ SOLN: 10 mg/mL (1 %) | INTRAMUSCULAR | Qty: 20

## 2012-09-03 NOTE — Op Note (Signed)
Pre-op diagnosis: Carpal Tunnel syndrome, bilateral ICD-354.0    Post op diagnosis: same    Procedure: Carpal Tunnel release left  cpt [phone]    Surgeon: Benita Gutter, MD    Anesthesia: MAC and local    Indications: The patient is a 79 y.o.  female who presented with left carpal tunnel syndrome which was unresponsive to nonoperative measures addressed in history and physical.  After discussion of risks and benefits, including risks of bleeding, infection, damage to surrounding structures, persistent pain, stiffness, and loss of function, risks of anesthesia, and other risks, the patient elected to proceed with the above procedure and signed operative consent.    Description of procedure: Patient was placed in the supine position and after appropriate time-out and side, site and procedure confirmed, a longitudinal incision was made in the palm just radial to the intrathenar eminence in line with the radial border of the ring finger under loupe magnification and palmar fascia was incised longitudinally.  Blunt retraction used to identify the transverse carpal ligament, which was divided longitudinally using retractors to protect and the superficial palmar arch.  The ligament was released in its entirety, and visualized at its most proximal and distal extent.  Wound was irrigated, tourniquet released and hemostasis was obtained with bipolar cautery. Skin edges were infiltrated with .25% sensorcaine.  Wound then closed with 4-0 nylon and sterile dressing applied.     Disposition: To PACU with no complications and follow up per routine.  Patient is instructed to remove dressings in two days and other precautions include avoidance of heavy and repetitive lifting for 2 weeks, when an appointment for follow up and suture removal will take place.  Patient may drive the next day.

## 2012-09-03 NOTE — Other (Signed)
Ok to give Ancef 2g per Dr. Dwyane Luo and Dr. Lucretia Roers with Penicillin Rash allergy

## 2012-09-03 NOTE — Other (Signed)
No block, Dr. Dwyane Luo stated only wants local in OR with anesthesia. Anesthesia made aware

## 2012-09-03 NOTE — H&P (Signed)
Orthopedic Admission History and Physical        NAME: Jenny [name]       DOB:  1928-11-03       MRN:  [id number]      Subjective:     Patient is a 79 y.o. female who presents with history of bilateral carpal tunnel syndrome.  Presents today for surgical treatment of left side.    Patient Active Problem List    Diagnosis Date Noted   ??? Pulmonary hypertension 11/21/2011     Past Medical History   Diagnosis Date   ??? Arthritis    ??? Hypertension    ??? GERD (gastroesophageal reflux disease)    ??? Arrhythmia      atrial Fib   ??? Arrhythmia      SSS   ??? Thyroid disease      hypo      Past Surgical History   Procedure Laterality Date   ??? Hx tah and bso     ??? Pr abdomen surgery proc unlisted  > 30 years ago     partial colectomy; pt not sure why   ??? Hx colonoscopy     ??? Hx pacemaker     ??? Hx orthopaedic  1994 and 1996     back surgery with rods   ??? Hx orthopaedic Bilateral      Bil knee replacements ; diff years   ??? Hx rotator cuff repair Right 1996     right   ??? Hx ankle fracture tx Left 1992     left   ??? Hx orthopaedic Right 1997     carpal tunnel   ??? Hx adenoidectomy     ??? Hx tonsillectomy        Prior to Admission medications    Medication Sig Start Date End Date Taking? Authorizing Provider   traZODone (DESYREL) 100 mg tablet Take 100 mg by mouth nightly.   Yes Historical Provider   lisinopril (PRINIVIL, ZESTRIL) 2.5 mg tablet Take 2.5 mg by mouth daily (after breakfast).   Yes Historical Provider   furosemide (LASIX) 20 mg tablet Take 20 mg by mouth daily (after breakfast).   Yes Historical Provider   DULoxetine (CYMBALTA) 60 mg capsule Take 60 mg by mouth daily (after breakfast).   Yes Historical Provider   gabapentin (NEURONTIN) 300 mg capsule Take 300 mg by mouth daily (after breakfast).   Yes Historical Provider   HYDROcodone-acetaminophen 5-500 mg cap Take 5-500 mg by mouth as needed.   Yes Historical Provider   levothyroxine (SYNTHROID) 75 mcg tablet Take 75 mcg by mouth Daily (before breakfast).   Yes  Historical Provider   spironolactone (ALDACTONE) 25 mg tablet Take 25 mg by mouth daily.   Yes Historical Provider   warfarin (COUMADIN) 2.5 mg tablet Take 2.5 mg by mouth daily.    Historical Provider   warfarin (COUMADIN) 2.5 mg tablet Take 2.5 mg by mouth daily. Takes 1/2 tab on Mon, Wed, Fri and Sun in the morning.    Historical Provider     Current Facility-Administered Medications   Medication Dose Route Frequency   ??? ceFAZolin in 0.9% NS (ANCEF) IVPB soln 2 g  2 g IntraVENous ONCE      Allergies   Allergen Reactions   ??? Iodinated Contrast Media - Iv Dye Itching   ??? Morphine Other (comments)     Migraine headaches severe   ??? Penicillins Rash   ??? Percocet (Oxycodone-Acetaminophen) Other (comments)  Starts to see things that are not there.   ??? Percodan (Oxycodone Hcl-Oxycodone-Asa) Other (comments)     "sends me in ORBIT   ??? Phenobarbital Unknown (comments)   ??? Talwin (Pentazocine Lactate) Unknown (comments)     Long time ago   ??? Valium (Diazepam) Rash      History   Substance Use Topics   ??? Smoking status: Never Smoker    ??? Smokeless tobacco: Never Used   ??? Alcohol Use: 3.5 oz/week     7 Glasses of wine per week      Comment: with dinner      History reviewed. No pertinent family history.     Review of Systems  A comprehensive review of systems was negative except for that written in the HPI.    Objective:     Patient Vitals for the past 8 hrs:   BP Temp Pulse Resp SpO2 Height Weight   09/03/12 1122 136/73 mmHg 98.4 ??F (36.9 ??C) 64 18 100 % - -   09/03/12 1034 - - - - - 5\' 6"  (1.676 m) 72.207 kg (159 lb 3 oz)     Temp (24hrs), Avg:98.4 ??F (36.9 ??C), Min:98.4 ??F (36.9 ??C), Max:98.4 ??F (36.9 ??C)      Physical Exam:  General appearance: alert, cooperative, no distress, appears stated age  Lungs: clear to auscultation bilaterally  Heart: regular rate and rhythm, S1, S2 normal, no murmur, click, rub or gallop  Abdomen: soft, non-tender, non-distended  Extremities: As per prior exam.   Pulses: 2+ and symmetric   Neurologic: Grossly normal    Labs: No results found for this or any previous visit (from the past 24 hour(s)).    Assessment:   Await INR.  If <2.5, no medical contraindications to proceeding with planned surgery.    Patient Active Problem List    Diagnosis Date Noted   ??? Pulmonary hypertension 11/21/2011         Plan:   Proceed with surgery  Pt. stable  Pt. NPO x meds

## 2012-09-03 NOTE — Anesthesia Post-Procedure Evaluation (Signed)
Post-Anesthesia Evaluation and Assessment    Patient: Jenny [name] MRN: [id number]  SSN: [id number]    Date of Birth: [birth year]  Age: 79 y.o.  Sex: female       Cardiovascular Function/Vital Signs  Visit Vitals   Item Reading   ??? BP 144/58   ??? Pulse 65   ??? Temp 36.7 ??C (98 ??F)   ??? Resp 18   ??? Ht 5\' 6"  (1.676 m)   ??? Wt 72.207 kg (159 lb 3 oz)   ??? BMI 25.71 kg/m2   ??? SpO2 100%       Patient is status post Regional anesthesia for Procedure(s):  LEFT CARPAL TUNNEL RELEASE.    Nausea/Vomiting: None    Postoperative hydration reviewed and adequate.    Pain:  Pain Scale 1: Numeric (0 - 10) (09/03/12 1346)  Pain Intensity 1: 0 (09/03/12 1346)   Managed    Neurological Status:   Neuro (WDL): Within Defined Limits (09/03/12 1346)  Neuro  LUE Motor Response: Weak (09/03/12 1309)   At baseline    Mental Status and Level of Consciousness: Alert and oriented     Pulmonary Status:   O2 Device: Room air (09/03/12 1337)   Adequate oxygenation and airway patent    Complications related to anesthesia: None    Post-anesthesia assessment completed. No concerns    Signed By: Tristan Schroeder, MD     September 03, 2012

## 2012-09-03 NOTE — Other (Signed)
Anesthesia preparing for placement of block.  Dr. Dwyane Luo advised of PT/INR result and he stated he is good to go for surgery

## 2012-09-03 NOTE — Op Note (Signed)
Pre-op diagnosis: Carpal Tunnel syndrome, bilateral ICD-354.0    Post op diagnosis: same    Procedure: Carpal Tunnel release left  cpt 64721    Surgeon: Selah Klang, MD    Anesthesia: MAC and local    Indications: The patient is a 79 y.o.  female who presented with left carpal tunnel syndrome which was unresponsive to nonoperative measures addressed in history and physical.  After discussion of risks and benefits, including risks of bleeding, infection, damage to surrounding structures, persistent pain, stiffness, and loss of function, risks of anesthesia, and other risks, the patient elected to proceed with the above procedure and signed operative consent.    Description of procedure: Patient was placed in the supine position and after appropriate time-out and side, site and procedure confirmed, a longitudinal incision was made in the palm just radial to the intrathenar eminence in line with the radial border of the ring finger under loupe magnification and palmar fascia was incised longitudinally.  Blunt retraction used to identify the transverse carpal ligament, which was divided longitudinally using retractors to protect and the superficial palmar arch.  The ligament was released in its entirety, and visualized at its most proximal and distal extent.  Wound was irrigated, tourniquet released and hemostasis was obtained with bipolar cautery. Skin edges were infiltrated with .25% sensorcaine.  Wound then closed with 4-0 nylon and sterile dressing applied.     Disposition: To PACU with no complications and follow up per routine.  Patient is instructed to remove dressings in two days and other precautions include avoidance of heavy and repetitive lifting for 2 weeks, when an appointment for follow up and suture removal will take place.  Patient may drive the next day.

## 2012-09-04 MED FILL — MIDAZOLAM 1 MG/ML IJ SOLN: 1 mg/mL | INTRAMUSCULAR | Qty: 2

## 2012-09-04 MED FILL — LIDOCAINE (PF) 20 MG/ML (2 %) IJ SOLN: 20 mg/mL (2 %) | INTRAMUSCULAR | Qty: 100

## 2012-09-04 MED FILL — DIPRIVAN 10 MG/ML INTRAVENOUS EMULSION: 10 mg/mL | INTRAVENOUS | Qty: 80

## 2012-09-04 MED FILL — FENTANYL CITRATE (PF) 50 MCG/ML IJ SOLN: 50 mcg/mL | INTRAMUSCULAR | Qty: 100

## 2013-07-23 ENCOUNTER — Encounter

## 2013-07-24 LAB — CELL COUNT, BODY FLUID
FLD LYMPHS: 20 %
FLD MONO/MACROPHAGES: 55 %
FLD NEUTROPHILS: 19 %
FLUID MESOTHELIAL: 6 %
FLUID RBC CT.: >100 /mm3
FLUID WBC COUNT: 2382 /mm3 — ABNORMAL HIGH (ref 0–5)

## 2013-07-24 MED ORDER — LIDOCAINE HCL 1 % (10 MG/ML) IJ SOLN
10 mg/mL (1 %) | Freq: Once | INTRAMUSCULAR | Status: AC
Start: 2013-07-24 — End: 2013-07-24
  Administered 2013-07-24: 15:00:00 via SUBCUTANEOUS

## 2013-07-29 ENCOUNTER — Encounter

## 2013-07-31 LAB — CULTURE, ANAEROBIC

## 2013-08-01 ENCOUNTER — Encounter

## 2013-08-02 MED ORDER — HEPARIN (PORCINE) 1,000 UNIT/ML IJ SOLN
1000 unit/mL | Freq: Once | INTRAMUSCULAR | Status: DC
Start: 2013-08-02 — End: 2013-08-06

## 2013-08-02 MED FILL — HEPARIN (PORCINE) 1,000 UNIT/ML IJ SOLN: 1000 unit/mL | INTRAMUSCULAR | Qty: 10

## 2013-08-07 LAB — CULTURE, WOUND W GRAM STAIN
Culture result:: NO GROWTH
GRAM STAIN: NONE SEEN

## 2013-10-31 ENCOUNTER — Encounter

## 2013-11-12 MED ORDER — IOHEXOL 180 MG/ML SOLN
180 mg iodine/mL | Freq: Once | INTRATHECAL | Status: AC
Start: 2013-11-12 — End: 2013-11-12
  Administered 2013-11-12: 13:00:00 via INTRATHECAL

## 2013-11-12 MED ORDER — SODIUM BICARBONATE 4 % IV
4 % | Freq: Once | INTRAVENOUS | Status: AC
Start: 2013-11-12 — End: 2013-11-12

## 2013-11-12 MED ADMIN — diphenhydrAMINE (BENADRYL) capsule 50 mg: ORAL | @ 13:00:00 | NDC 00904205661

## 2013-11-12 MED FILL — OMNIPAQUE 180 MG IODINE/ML INTRATHECAL SOLUTION: 180 mg iodine/mL | INTRATHECAL | Qty: 20

## 2013-11-12 MED FILL — NEUT 4 % INTRAVENOUS SOLUTION: 4 % | INTRAVENOUS | Qty: 2

## 2013-11-12 MED FILL — DIPHENHYDRAMINE 50 MG CAP: 50 mg | ORAL | Qty: 1

## 2013-11-12 NOTE — Other (Signed)
Discharged via wheel chair to friend.  No complaints voiced.

## 2014-10-26 NOTE — Discharge Summary (Signed)
PATIENT NAME:  Leslie Hunt, [place] MR#:  [id number] DATE OF BIRTH:  1928-11-13  DATE OF ADMISSION:  07/01/2011 DATE OF DISCHARGE:  07/04/2011  ADMITTING DIAGNOSIS: Fall and fractures of ribs.   DISCHARGE DIAGNOSES:  1. Left 5th and 6th anterior rib fractures.   2. Left lower extremity contusion after fall.  3. Hypertension.  4. Acute renal failure.  5. Coagulopathy, acquired due to Coumadin use.  6. Hyperkalemia, resolved.  7. Left lower extremity wound.  8. Hypothyroidism.  9. Atrial fibrillation, status post cardioversion in the past.  10. Arthritis.   DISCHARGE CONDITION: Stable.   DISCHARGE MEDICATIONS: The patient is to resume her outpatient medications of: 1. Gabapentin, unknown dose, as previously prescribed.  2. Cymbalta 60 mg p.o. twice daily.  3. Levothroid 75 mcg p.o. daily.  4. Bystolic 5 mg p.o. daily.  5. Trazodone 150 mg p.o. at bedtime. 6. Mupirocin topically to left lower extremity wound daily.  7. Vicodin, unknown dose, unknown frequency.   ADDITIONAL MEDICATIONS:  1. Tylenol 650 mg p.o. every 4 hours as needed.  2. Norco 5/325 mg every 4 hours as needed.  3. Dilaudid 1 mg every four hours as needed.   NOTE: The patient is not to take Coumadin until recommended by primary care physician.   DISCHARGE INSTRUCTIONS:  1. The patient is to continue to keep left lower extremity wound dry and clean. She is to keep a bandage in place unless it has become loose or soiled. She is to continue mupirocin on topically daily.  2. Home oxygen: None.  3. Diet: 2 grams salt, mechanical soft.  4. Activity limitations: As tolerated.   REFERRAL: Home Health physical therapy 2 to 7 times a week.   FOLLOWUP:  1. Follow-up appointment with Dr. Beverely RisenFozia Khan in two days after discharge on the 07/06/2011.   2. She is to have pro time checked in two days.  3. She is to follow up with the [facility] in two days after discharge as well.   CONSULTANTS: Wound Care, Ms.Partin Leslie Leashonna, RN  and  Care Management.   LABORATORY, DIAGNOSTIC AND RADIOLOGICAL DATA:   Left shoulder complete x-ray 07/01/2011 showed no acute bony abnormality in the left shoulder. There are no fractures of the lateral aspect of the right 5th and 6th ribs. A tiny pneumothorax is present in the left pulmonary apex, according to the radiologist.  Repeated x-ray, left rib unilateral, on 07/01/2011 showed sustained fractures in anterolateral aspect of 5th and 6th ribs on the left.  Chest PA and lateral on 07/01/2011 revealed findings suggesting low-grade congestive heart failure. No focal pneumonia. No evidence of pneumothorax or definite evidence of rib fracture noted.  Chest, PA and lateral repeated on 12/29/ 2012 showed no acute disease of the chest.  CT of the head without contrast on 07/01/2011 showed involutional changes without evidence of acute abnormalities.  CT of the cervical spine without contrast on 07/02/2011 revealed no acute osseous injury in the cervical spine. Ligamentous injury is not evaluated, and there is high clinical concern for ligamentous injury. Consider MRI or flexion-extension radiographs as clinically indicated and tolerated according to the radiologist  The patient's lab data on admission 07/01/2011 revealed acute renal failure with BUN/creatinine of 30/1.71. On 07/01/2011, his glucose level was 201, potassium level 5.7, and CK level of 226. The patient's CBC was within normal limits except a high MCV of 104.  The patient's pro time was markedly elevated at 76.1, INR 10.1.  Urinalysis showed yellow clear urine,  specific gravity 1.024, pH 5.0. It was negative for blood, protein, nitrites. Trace leukocyte esterase was noted, 3 red blood cells as well as 3 white blood cells were also noted.   HISTORY AND PHYSICAL: The patient is an 81 year old Caucasian female who was visiting from [place] in [place] in her son's home, presented to the hospital on 07/01/2011 after a fall. Please refer to  Dr. Lupe Carney admission note on 07/01/2011. In the Emergency Room, she was also noted to have markedly elevated pro time and INR.   Her vital signs on the day of admission, temperature was 96.9, pulse 69, respiration rate 18, blood pressure 189/86, saturation was 99% on oxygen therapy. On physical exam, she had left lower extremity stitches, some bruising as well as hematoma around it but no drainage. The  patient's chest x-ray and rib x-ray revealed a fracture of the left ribs small pneumothorax. However, repeated chest x-rays did not show a pneumothorax.   HOSPITAL COURSE: The patient was admitted to the hospital for further evaluation and treatment. She received pain medications for her rib fractures. She also underwent physical therapist evaluation. Physical therapist recommended for her Home Health. She will be discharged to the patient's son's home for Home Health. However, because the patient is nonlocal, she will need to see a local physician prior to being signed off  for Home Health services.  For this reason, the patient will be getting to Dr. Dalphine Handing office in next few days after discharge. Meanwhile, she is to continue pain medications, and she is to continue physical therapy as she is able to tolerate with the patient's family's help. She was complaining of some left leg contusion and pains. However, as mentioned above, the patient did not have any fractures noted. The patient was able to ambulate without any significant discomfort, however.   The patient was noted to have significantly elevated blood pressure, initially, however, blood pressure improved with her pain control. On the day of discharge, December 31/2012, the patient's vitals are stable with temperature 98.4, pulse 78, respiration rate 18, blood pressure 106/61, and saturation 93% on room air at rest.   In regards to acute renal failure, it was felt that the patient's acute renal failure was related to mild dehydration as  well as possibly mild rhabdomyolysis due to fall. The patient was given IV fluids, and the patient's kidney function normalized. On 07/04/2011, the patient's BUN/creatinine were 16/1.25, completely normalized. The patient's urine cultures were also checked, but they did not show any growth. It was felt that the patient's acute renal failure was related to dehydration rather than infection. No antibiotics were administered for the patient while she was in the hospital.  In regards to coagulopathy, the patient's Coumadin was stopped. The patient was given vitamin K, and her pro time was checked daily. It was improving as time progressed. On 07/04/2011, the patient's pro time as well as INR are 36.1 and 33.8, respectively. It is recommended to follow the patient's pro time levels and reinitiate her Coumadin therapy depending on her Coumadin pro time level as an outpatient. Meanwhile, the patient is to hold her Coumadin therapy. She is to have her pro time checked in two days on 07/06/2011, and decisions will be made by Dr. Beverely Risen in her office to reinstate her Coumadin therapy as needed. Apparently the patient has been taking Coumadin therapy for atrial fibrillation;  however, as she was cardioverted and remained in sinus rhythm, we did not think at this time  that we needed to necessarily worry about her Coumadin level; and even if she is not therapeutic for one day, it probably will not be that significant a problem at this time.   The patient was noted to be hyperkalemic on the day of admission; however, with IV fluid hydration the patient's potassium level, which was very likely dependent on acute renal failure, normalized. On the day of discharge, the patient's potassium level is 4.3.  It is recommended to follow the patient's potassium level if there is any suspicion that the patient may have dehydration again.   The patient was noted to have a left lower extremity wound medial posterior left lower  extremity, had a cut which was due to recent fall. Approximately 10 days ago, the patient had a fall resulting in that wound. She had a laceration in that area which was approximately 3 inches in length. It appeared that the patient had mild erythema as well as slight edema, some warmth as well as a scant amount of serosanguineous drainage noted on the dressing, however, no odor. Also, the patient had bulla which was filled with serosanguineous fluid posterior to the wound. Apparently the patient was supposed to use mupirocin ointment daily with nonadherent dressing as well as antibiotics orally with Septra DS; however, the Septra DS could have caused the patient to have hyperkalemia, and this was stopped. A Wound Care specialist nurse, Leslie Leash, was consulted and recommended to start the patient on mupirocin ointment. The patient is to continue wound management at home. She is to follow up with [facility] in the next few days. At that time, they plan to remove sutures. They also will re-evaluate the patient's wound and make decisions about antibiotic therapy, if needed.  Regarding history of hypothyroidism, the patient is to continue Synthroid.   In regards to history of atrial fibrillation, no other recommendations were made except to continue Bystolic for rate control, which was well controlled, and to hold Coumadin therapy at this time until she is seen by Dr. Beverely Risen.  The patient is being discharged from the hospital in stable condition with the above-mentioned medications and follow-up.   TIME SPENT: 40 minutes.  ____________________________ Katharina Caper, MD rv:cbb D: 07/04/2011 20:38:00 ET T: 07/07/2011 11:16:49 ET JOB#: [id number]  cc: Lyndon Code, MD Katharina Caper, MD, <Dictator>  Coltyn Hanning Winona Legato MD ELECTRONICALLY SIGNED 08/14/2011 19:57

## 2014-10-26 NOTE — H&P (Signed)
PATIENT NAME:  Leslie Hunt, Leslie Hunt MR#:  [id number] DATE OF BIRTH:  [birth year]  DATE OF ADMISSION:  07/01/2011  REFERRING PHYSICIAN: Dr. Carollee Massed.    CHIEF COMPLAINT:  Status post fall yesterday, left-sided pain, elevated INR.   HISTORY OF PRESENT ILLNESS: The patient is an 81 year old Caucasian female with history of atrial fibrillation on Coumadin, status post cardioversions in the past, who had a mechanical fall yesterday at 9:00 p.m. Apparently the patient was wearing socks and was on hardwood floor and was playing with her grandson and slipped on the hardwood floor. The patient fell on her left hip area and had left-sided pain. On arrival here she was noted to have left-sided rib fractures on #5 and 6. On this x-ray dedicated to ribs and chest x-ray, no pneumothorax was seen. However, on the left shoulder complete x-ray series a tiny pneumothorax was also seen. The patient has some painful respiration but no chest pain or shortness of breath. Furthermore, her potassium was elevated at 5.7, and she has a GFR on the lower side currently being 31.   Per history, the patient had a laceration on the left leg about 12 days ago and required multiple stitches. The patient was started on Bactrim at that time and she has had taken it until today of note. The patient has no fevers, chills. No chest pain or palpitations or blurry vision prior or after the fall.   PAST MEDICAL HISTORY:  1. Hypothyroidism.   2. Atrial fibrillation, status post cardioversion x3, last one being a month ago.  3. Hypertension.  4. History of unknown heart disease.   5. Insomnia.   6. Chronic arthritis.   SURGICAL HISTORY:  1. Back surgery.   2. Bilateral total knee replacements.  3. Right hip replacement.   4. Left ankle fusion.   5. Right rotator cuff surgery.   6. Status post pacemaker.   MEDICATIONS:  1. Gabapentin dose unknown.  2. Cymbalta 60 mg daily.  3. Levothyroxine 75 mcg daily.  4. Coumadin 2.5 mg daily  except for Tuesdays and Thursdays which is 5 mg.   5. Bystolic 5 mg daily.   6. Spironolactone 25 mg daily.   7. Mupirocin cream to lower leg.  8. Trazodone 150 mg at bedtime.  9. Vicodin as needed.  10. Bactrim double strength 1 tab p.o. b.i.d., started around 06/15/2011.   ALLERGIES: Penicillin, Percocet, Valium, and Percodan.   FAMILY HISTORY: Mother with migraines and arthritis.     SOCIAL HISTORY: No tobacco. Drinks 1 to 2 drinks per night. No drug use. Lives in [place], is visiting here.    REVIEW OF SYSTEMS. CONSTITUTIONAL: No fever or fatigue. No weakness. There is left thigh and hip pain. No weight changes. EYES: No blurry vision or double vision. History of cataract surgery. ENT: No tinnitus or hearing loss. RESPIRATORY: No cough or wheezing or hemoptysis. No dyspnea or asthma. Some painful respiration on the left side. CARDIOVASCULAR: No chest pain. No orthopnea. No edema.  History of atrial fibrillation and palpitations in the past. No syncope. History of high blood pressure. GASTROINTESTINAL: No nausea or vomiting. No diarrhea. No abdominal pain. GENITOURINARY: No dysuria or hematuria. No frequency. ENDOCRINE: No polyuria or nocturia. The patient is hypothyroid.  HEMOLYMPH:  No anemia or easy bruising. SKIN: There is left thigh swelling. NEUROLOGIC: No numbness or weakness. PSYCHIATRIC: There is insomnia.   PHYSICAL EXAMINATION:  VITAL SIGNS: Temperature 96.9, pulse 69, respiratory rate 18, blood pressure 189/86, oxygen saturation 99%.  GENERAL: The patient is an elderly female lying in bed. No obvious distress, awake.   HEENT: Normocephalic, atraumatic. Pupils are equal and reactive. Anicteric sclerae. Moist mucous membranes.   NECK: Supple. No JVD, no thyroid tenderness.   CARDIOVASCULAR: S1, S2, irregularly irregular. No murmurs appreciated.   LUNGS: Basilar crackles bilaterally. Good air entry otherwise.   ABDOMEN: Soft, nontender, nondistended. Positive bowel sounds.    EXTREMITIES: No significant lower extremity edema. There is medial aspect of the left leg stitches intact. No bleeding. There is some bruising and hematoma around it, no drainage.   NEUROLOGIC: Cranial nerves II through XII grossly intact. Strength five out of five in bilateral upper extremities and the right lower extremity.  Did not test left side because of pain. There is some tenderness to palpation of the lateral aspect of the left thigh.   PSYCHIATRIC: Awake, alert, oriented x3.   LABORATORIES: Glucose 201, BUN 30, creatinine 1.71, potassium 5.7, anion gap is 6. WBC 6.2, hemoglobin 14.4, hematocrit 43.8, platelets are 228,000. INR is 10.1. CT of the head without contrast showing involutional changes without evidence of acute abnormalities. Chest, PA and lateral, suggest low-grade congestive heart failure. No focal pneumonia. No pneumothorax or different evidence of rib fracture with left unilateral x-ray showing sustained fractures of the anterolateral aspect of the 5th and 6th ribs on the left shoulder, left complete showing no acute bony abnormalities of the left shoulder. It is also read as a tiny pneumothorax present in the left pulmonary apex.     ASSESSMENT AND PLAN: We have an 81 year old Caucasian female with atrial fibrillation status post cardioversion, now appears to be in sinus, hypertension, hypothyroidism, insomnia, who is on Coumadin status post fall with rib fractures, therapeutic INR, elevated blood pressure, hyperkalemia and likely renal failure, possibly acute versus chronic.   At this point we will admit the patient to the hospital and start the patient on pain medicines including morphine and get a PT consult. The patient has no hypoxia or shortness of breath. We will get another x-ray of the chest in the morning to revaluate for that pneumothorax. Of note, it was not caught in 2 other x-rays of the chest and it is small.   For the hyperkalemia we would not resume the  Bactrim which was started after the lacerations of the lower extremity. She has no fever, leukocytosis or drainage to suggest she is having an infection. We would also hold spironolactone. It is 5.7 and we would give Kayexalate and repeat it in the morning.   In regards to the atrial fibrillation she appears to be in sinus, withhold the Coumadin. Would continue the beta-blocker. The patient was given vitamin K x1 and there is no acute bleed and hemoglobin and hematocrit are stable. Would repeat PT in the a.m.   In regards to her elevated blood pressure we will start IV hydralazine p.r.n. and resume her Bystolic.   CODE STATUS: Patient is full code.   TOTAL TIME SPENT: 55 minutes.    ____________________________ Krystal EatonShayiq Suzy Kugel, MD sa:vtd D: 07/01/2011 20:12:53 ET T: 07/02/2011 07:06:32 ET JOB#: [id number]  cc: Krystal EatonShayiq Jasimine Simms, MD, <Dictator> Krystal EatonSHAYIQ Zachory Mangual MD ELECTRONICALLY SIGNED 07/15/2011 20:36

## 2014-10-26 NOTE — Discharge Summary (Signed)
PATIENT NAME:  Leslie Hunt, Leslie Hunt MR#:  [id number] DATE OF BIRTH:  1929-01-21  DATE OF ADMISSION:  07/01/2011 DATE OF DISCHARGE:  07/04/2011  ADMITTING DIAGNOSIS: Fall and fractures of ribs.   DISCHARGE DIAGNOSES:  1. Left 5th and 6th anterior rib fractures.   2. Left lower extremity contusion after fall.  3. Hypertension.  4. Acute renal failure.  5. Coagulopathy, acquired due to Coumadin use.  6. Hyperkalemia, resolved.  7. Left lower extremity wound.  8. Hypothyroidism.  9. Atrial fibrillation, status post cardioversion in the past.  10. Arthritis.   DISCHARGE CONDITION: Stable.   DISCHARGE MEDICATIONS: The patient is to resume her outpatient medications of: 1. Gabapentin, unknown dose, as previously prescribed.  2. Cymbalta 60 mg p.o. twice daily.  3. Levothroid 75 mcg p.o. daily.  4. Bystolic 5 mg p.o. daily.  5. Trazodone 150 mg p.o. at bedtime. 6. Mupirocin topically to left lower extremity wound daily.  7. Vicodin, unknown dose, unknown frequency.   ADDITIONAL MEDICATIONS:  1. Tylenol 650 mg p.o. every 4 hours as needed.  2. Norco 5/325 mg every 4 hours as needed.  3. Dilaudid 1 mg every four hours as needed.   NOTE: The patient is not to take Coumadin until recommended by primary care physician.   DISCHARGE INSTRUCTIONS:  1. The patient is to continue to keep left lower extremity wound dry and clean. She is to keep a bandage in place unless it has become loose or soiled. She is to continue mupirocin on topically daily.  2. Home oxygen: None.  3. Diet: 2 grams salt, mechanical soft.  4. Activity limitations: As tolerated.   REFERRAL: Home Health physical therapy 2 to 7 times a week.   FOLLOWUP:  1. Follow-up appointment with Dr. Beverely Risen in two days after discharge on the 07/06/2011.   2. She is to have pro time checked in two days.  3. She is to follow up with the Wound Clinic in two days after discharge as well.   CONSULTANTS: Wound Care, <<MISSING TEXT>> ,  Care Management.   LABORATORY, DIAGNOSTIC AND RADIOLOGICAL DATA:   Left shoulder complete x-ray 07/01/2011 showed no acute bony abnormality in the left shoulder. There are no fractures of the lateral aspect of the right 5th and 6th ribs. A tiny pneumothorax is present in the left pulmonary apex, according to the radiologist.  Repeated x-ray, left rib unilateral, on 07/01/2011 showed sustained fractures in anterolateral aspect of 5th and 6th ribs on the left.  Chest PA and lateral on 07/01/2011 revealed findings suggesting low-grade congestive heart failure. No focal pneumonia. No evidence of pneumothorax or definite evidence of rib fracture noted.  Chest, PA and lateral repeated on 12/29/ 2012 showed no acute disease of the chest.  CT of the head without contrast on 07/01/2011 showed involutional changes without evidence of acute abnormalities.  CT of the cervical spine without contrast on 07/02/2011 revealed no acute osseous injury in the cervical spine. Ligamentous injury is not evaluated, and there is high clinical concern for ligamentous injury. Consider MRI or flexion-extension radiographs as clinically indicated and tolerated according to the radiologist  The patient's lab data on admission 07/01/2011 revealed acute renal failure with BUN/creatinine of 30/1.71. On 07/01/2011, his glucose level was 201, potassium level 5.7, and CK level of 226. The patient's CBC was within normal limits except a high MCV of 104.  The patient's pro time was markedly elevated at 76.1, INR 10.1.  Urinalysis showed yellow clear urine, specific gravity  1.024, pH 5.0. It was negative for blood, protein, nitrites. Trace leukocyte esterase was noted, 3 red blood cells as well as 3 white blood cells were also noted.   HISTORY AND PHYSICAL: The patient is an 81 year old Caucasian female who was visiting from [place] in her son's home, presented to the hospital on 07/01/2011 after a fall. Please refer to Dr.  Lupe CarneyAhmadzia's admission note on 07/01/2011. In the Emergency Room, she was also noted to have markedly elevated pro time and INR.   Her vital signs on the day of admission, temperature was 96.9, pulse 69, respiration rate 18, blood pressure 189/86, saturation was 99% on oxygen therapy. On physical exam, she had left lower extremity stitches, some bruising as well as hematoma around it but no drainage. The  patient's chest x-ray and rib x-ray revealed a fracture of the left ribs small pneumothorax. However, repeated chest x-rays did not show a pneumothorax.   HOSPITAL COURSE: The patient was admitted to the hospital for further evaluation and treatment. She received pain medications for her rib fractures. She also underwent physical therapist evaluation. Physical therapist recommended for her Home Health. She will be discharged to the patient's son's home for Home Health. However, because the patient is nonlocal, she will need to see a local physician prior to being signed off  for Home Health services.  For this reason, the patient will be getting to Dr. Dalphine HandingFozia Khan's office in next few days after discharge. Meanwhile, she is to continue pain medications, and she is to continue physical therapy as she is able to tolerate with the patient's family's help. She was complaining of some left leg contusion and pains. However, as mentioned above, the patient did not have any fractures noted. The patient was able to ambulate without any significant discomfort, however.   The patient was noted to have significantly elevated blood pressure, initially, however, blood pressure improved with her pain control. On the day of discharge, December 31/2012, the patient's vitals are stable with temperature 98.4, pulse 78, respiration rate 18, blood pressure 106/61, and saturation 93% on room air at rest.   In regards to acute renal failure, it was felt that the patient's acute renal failure was related to mild dehydration as well  as possibly mild rhabdomyolysis due to fall. The patient was given IV fluids, and the patient's kidney function normalized. On 07/04/2011, the patient's BUN/creatinine were 16/1.25, completely normalized. The patient's urine cultures were also checked, but they did not show any growth. It was felt that the patient's acute renal failure was related to dehydration rather than infection. No antibiotics were administered for the patient while she was in the hospital.  In regards to coagulopathy, the patient's Coumadin was stopped. The patient was given vitamin K, and her pro time was checked daily. It was improving as time progressed. On 07/04/2011, the patient's pro time as well as INR are 36.1 and 33.8, respectively. It is recommended to follow the patient's pro time levels and reinitiate her Coumadin therapy depending on her Coumadin pro time level as an outpatient. Meanwhile, the patient is to hold her Coumadin therapy. She is to have her pro time checked in two days on 07/06/2011, and decisions will be made by Dr. Beverely RisenFozia Khan in her office to reinstate her Coumadin therapy as needed. Apparently the patient has been taking Coumadin therapy for atrial fibrillation;  however, as she was cardioverted and remained in sinus rhythm, we did not think at this time that we  needed to necessarily worry about her Coumadin level; and even if she is not therapeutic for one day, it probably will not be that significant a problem at this time.   The patient was noted to be hyperkalemic on the day of admission; however, with IV fluid hydration the patient's potassium level, which was very likely dependent on acute renal failure, normalized. On the day of discharge, the patient's potassium level is 4.3.  It is recommended to follow the patient's potassium level if there is any suspicion that the patient may have dehydration again.   The patient was noted to have a left lower extremity wound medial posterior left lower extremity,  had a cut which was due to recent fall. Approximately 10 days ago, the patient had a fall resulting in that wound. She had a laceration in that area which was approximately 3 inches in length. It appeared that the patient had mild erythema as well as slight edema, some warmth as well as a scant amount of serosanguineous drainage noted on the dressing, however, no odor. Also, the patient had <<MISSING TEXT>> which was filled with serosanguineous fluid posterior to the wound. Apparently the patient was supposed to use mupirocin ointment daily with nonadherent dressing as well as antibiotics orally with Septra DS; however, the Septra DS could have caused the patient to have hyperkalemia, and this was stopped. A Wound Care specialist nurse, Lupita Leash, was consulted and recommended to start the patient on mupirocin ointment. The patient is to continue wound management at home. She is to follow up with [facility] in the next few days. At that time, they plan to remove sutures. They also will re-evaluate the patient's wound and make decisions about antibiotic therapy, if needed.  Regarding history of hypothyroidism, the patient is to continue Synthroid.   In regards to history of atrial fibrillation, no other recommendations were made except to continue Bystolic for rate control, which was well controlled, and to hold Coumadin therapy at this time until she is seen by Dr. Beverely Risen.  The patient is being discharged from the hospital in stable condition with the above-mentioned medications and follow-up.   TIME SPENT: 40 minutes.  ____________________________ Katharina Caper, MD rv:cbb D: 07/04/2011 20:38:56 ET T: 07/07/2011 11:16:49 ET JOB#: [id number]  cc: Katharina Caper, MD, <Dictator> Lyndon Code, MD

## 2015-03-10 ENCOUNTER — Inpatient Hospital Stay: Admit: 2015-03-10 | Discharge: 2015-03-10 | Payer: MEDICARE | Primary: Internal Medicine

## 2015-03-10 ENCOUNTER — Encounter

## 2015-03-10 DIAGNOSIS — W19XXXA Unspecified fall, initial encounter: Secondary | ICD-10-CM

## 2015-11-12 ENCOUNTER — Encounter

## 2015-11-24 ENCOUNTER — Inpatient Hospital Stay: Admit: 2015-11-24 | Payer: MEDICARE | Attending: Body Imaging | Primary: Internal Medicine

## 2015-11-24 DIAGNOSIS — M25551 Pain in right hip: Secondary | ICD-10-CM

## 2015-11-24 MED ORDER — SODIUM CHLORIDE 0.9 % INJECTION
Freq: Once | INTRAMUSCULAR | Status: AC
Start: 2015-11-24 — End: 2015-11-24
  Administered 2015-11-24: 19:00:00 via INTRAVENOUS

## 2015-11-24 MED ORDER — LIDOCAINE (PF) 10 MG/ML (1 %) IJ SOLN
10 mg/mL (1 %) | Freq: Once | INTRAMUSCULAR | Status: AC
Start: 2015-11-24 — End: 2015-11-25

## 2015-11-24 MED FILL — SODIUM CHLORIDE 0.9 % INJECTION: INTRAMUSCULAR | Qty: 10

## 2015-11-24 MED FILL — XYLOCAINE-MPF 10 MG/ML (1 %) INJECTION SOLUTION: 10 mg/mL (1 %) | INTRAMUSCULAR | Qty: 5

## 2016-06-10 ENCOUNTER — Inpatient Hospital Stay: Admit: 2016-06-10 | Discharge: 2016-06-13 | Payer: MEDICARE | Primary: Internal Medicine

## 2016-06-10 ENCOUNTER — Encounter

## 2016-06-10 DIAGNOSIS — M25551 Pain in right hip: Secondary | ICD-10-CM

## 2017-08-04 ENCOUNTER — Emergency Department: Admit: 2017-08-04 | Payer: MEDICARE | Primary: Internal Medicine

## 2017-08-04 ENCOUNTER — Inpatient Hospital Stay
Admit: 2017-08-04 | Discharge: 2017-08-12 | Disposition: A | Discharge status: Skilled Nursing Facility | Payer: MEDICARE | Attending: Internal Medicine | Admitting: Internal Medicine

## 2017-08-04 DIAGNOSIS — M109 Gout, unspecified: Principal | ICD-10-CM

## 2017-08-04 LAB — CBC W/O DIFF
ABSOLUTE NRBC: 0 10*3/uL (ref 0.00–0.01)
HCT: 44.8 % (ref 35.0–47.0)
HGB: 14.4 g/dL (ref 11.5–16.0)
MCH: 30.1 PG (ref 26.0–34.0)
MCHC: 32.1 g/dL (ref 30.0–36.5)
MCV: 93.5 FL (ref 80.0–99.0)
MPV: 10.4 FL (ref 8.9–12.9)
NRBC: 0 PER 100 WBC
PLATELET: 254 10*3/uL (ref 150–400)
RBC: 4.79 M/uL (ref 3.80–5.20)
RDW: 18.5 % — ABNORMAL HIGH (ref 11.5–14.5)
WBC: 5.8 10*3/uL (ref 3.6–11.0)

## 2017-08-04 LAB — METABOLIC PANEL, COMPREHENSIVE
A-G Ratio: 0.9 — ABNORMAL LOW (ref 1.1–2.2)
ALT (SGPT): 24 U/L (ref 12–78)
AST (SGOT): 25 U/L (ref 15–37)
Albumin: 3.1 g/dL — ABNORMAL LOW (ref 3.5–5.0)
Alk. phosphatase: 140 U/L — ABNORMAL HIGH (ref 45–117)
Anion gap: 10 mmol/L (ref 5–15)
BUN/Creatinine ratio: 13 (ref 12–20)
BUN: 20 MG/DL (ref 6–20)
Bilirubin, total: 0.6 MG/DL (ref 0.2–1.0)
CO2: 29 mmol/L (ref 21–32)
Calcium: 8.7 MG/DL (ref 8.5–10.1)
Chloride: 101 mmol/L (ref 97–108)
Creatinine: 1.57 MG/DL — ABNORMAL HIGH (ref 0.55–1.02)
GFR est AA: 38 mL/min/{1.73_m2} — ABNORMAL LOW (ref >60)
GFR est non-AA: 31 mL/min/{1.73_m2} — ABNORMAL LOW (ref >60)
Globulin: 3.3 g/dL (ref 2.0–4.0)
Glucose: 94 mg/dL (ref 65–100)
Potassium: 4 mmol/L (ref 3.5–5.1)
Protein, total: 6.4 g/dL (ref 6.4–8.2)
Sodium: 140 mmol/L (ref 136–145)

## 2017-08-04 LAB — URINALYSIS W/MICROSCOPIC
Bacteria: NEGATIVE /hpf
Bilirubin: NEGATIVE
Blood: NEGATIVE
Glucose: NEGATIVE mg/dL
Ketone: NEGATIVE mg/dL
Leukocyte Esterase: NEGATIVE
Nitrites: NEGATIVE
Protein: NEGATIVE mg/dL
Specific gravity: 1.01 (ref 1.003–1.030)
Urobilinogen: 0.2 EU/dL (ref 0.2–1.0)
pH (UA): 5.5 (ref 5.0–8.0)

## 2017-08-04 LAB — SAMPLES BEING HELD

## 2017-08-04 LAB — PROTHROMBIN TIME + INR
INR: 2.7 — ABNORMAL HIGH (ref 0.9–1.1)
Prothrombin time: 26.5 s — ABNORMAL HIGH (ref 9.0–11.1)

## 2017-08-04 LAB — PTT: aPTT: 27.7 s (ref 22.1–32.0)

## 2017-08-04 LAB — TROPONIN I: Troponin-I, Qt.: 0.1 ng/mL — ABNORMAL HIGH (ref <0.05)

## 2017-08-04 LAB — URINE CULTURE HOLD SAMPLE

## 2017-08-04 MED ORDER — FENTANYL CITRATE (PF) 50 MCG/ML IJ SOLN
50 mcg/mL | Freq: Once | INTRAMUSCULAR | Status: AC
Start: 2017-08-04 — End: 2017-08-04
  Administered 2017-08-04: 22:00:00 via INTRAVENOUS

## 2017-08-04 MED ORDER — ACETAMINOPHEN 325 MG TABLET
325 mg | ORAL | Status: AC
Start: 2017-08-04 — End: 2017-08-04
  Administered 2017-08-04: 19:00:00 via ORAL

## 2017-08-04 MED ORDER — SODIUM CHLORIDE 0.9 % IJ SYRG
Freq: Three times a day (TID) | INTRAMUSCULAR | Status: DC
Start: 2017-08-04 — End: 2017-08-12
  Administered 2017-08-04 – 2017-08-12 (×24): via INTRAVENOUS

## 2017-08-04 MED ORDER — SODIUM CHLORIDE 0.9 % IJ SYRG
INTRAMUSCULAR | Status: DC | PRN
Start: 2017-08-04 — End: 2017-08-12

## 2017-08-04 MED FILL — ACETAMINOPHEN 325 MG TABLET: 325 mg | ORAL | Qty: 2

## 2017-08-04 MED FILL — FENTANYL CITRATE (PF) 50 MCG/ML IJ SOLN: 50 mcg/mL | INTRAMUSCULAR | Qty: 2

## 2017-08-04 MED FILL — NORMAL SALINE FLUSH 0.9 % INJECTION SYRINGE: INTRAMUSCULAR | Qty: 40

## 2017-08-04 NOTE — H&P (Addendum)
Hospitalist Admission Note    NAME:  Jenny [name]   DOB:   24-Aug-1928   MRN:   [id number]     Date/Time:  08/04/2017 7:31 PM    Subjective:     CHIEF COMPLAINT:    Chief Complaint   Patient presents with   ??? Fall       HISTORY OF PRESENT ILLNESS:     Jenny [name] is a 84 y.o.  female with h/o gout,cardiac arrhythmia,valvular disease,pulm hypertension,hypothyroidism,gerd who is a transfer from [place] ED where she presented because of falls.Patient says that she was admitted to the [facility] few weeks ago due to acute gout,then went to a rehab center and returned home six days ago.She is followed by home health nurse twice a week.She lives alone and uses a cane to move around.Today morning patient fell while trying to walk to get her clothes.She felt dizzy before the fall but did not pass out.Home health nurse was present, called EMS and patient was taken to [place].In ED,CT head did not show any acute process.Labs showed troponin 0.10.Patient denies chest pain,headaches,short of breath at the present time.She is only saying that she wants to be able to walk well.     Past Medical History:   Diagnosis Date   ??? Arrhythmia     atrial Fib   ??? Arrhythmia     SSS   ??? Arthritis    ??? GERD (gastroesophageal reflux disease)    ??? Hypertension    ??? Thyroid disease     hypo        Social History     Tobacco Use   ??? Smoking status: Never Smoker   ??? Smokeless tobacco: Never Used   Substance Use Topics   ??? Alcohol use: Yes     Alcohol/week: 3.5 oz     Types: 7 Glasses of wine per week     Comment: with dinner        History reviewed. No pertinent family history.     Allergies   Allergen Reactions   ??? Iodinated Contrast- Oral And Iv Dye Itching   ??? Morphine Other (comments)     Migraine headaches severe   ??? Penicillins Rash   ??? Percocet [Oxycodone-Acetaminophen] Other (comments)     Starts to see things that are not there.   ??? Percodan [Oxycodone Hcl-Oxycodone-Asa] Other (comments)     "sends me in ORBIT    ??? Phenobarbital Unknown (comments)   ??? Talwin [Pentazocine Lactate] Unknown (comments)     Long time ago   ??? Valium [Diazepam] Rash        Prior to Admission medications    Medication Sig Start Date End Date Taking? Authorizing Provider   predniSONE (DELTASONE) 5 mg tablet Take 5 mg by mouth.   Yes Other, Phys, MD   AMLODIPINE BESYLATE, BULK, by Does Not Apply route.   Yes Other, Phys, MD   amLODIPine (NORVASC) 2.5 mg tablet Take 2.5 mg by mouth daily.   Yes Other, Phys, MD   colchicine (COLCRYS) 0.6 mg tablet Take 0.6 mg by mouth daily.   Yes Other, Phys, MD   azithromycin (ZITHROMAX) 250 mg tablet Take 250 mg by mouth daily.   Yes Other, Phys, MD   cefUROXime (CEFTIN) 500 mg tablet Take 500 mg by mouth two (2) times a day.   Yes Other, Phys, MD   diclofenac (VOLTAREN) 1 % gel Apply 1 g to affected area.   Yes Other, Phys, MD  HYDROcodone-acetaminophen (NORCO) 5-325 mg per tablet Take 1 Tab by mouth.   Yes Other, Phys, MD   DULoxetine (CYMBALTA) 60 mg capsule Take 60 mg by mouth.   Yes Provider, Historical   warfarin (COUMADIN) 1 mg tablet Take 1 mg by mouth daily.   Yes Provider, Historical   levothyroxine (SYNTHROID) 88 mcg tablet Take 88 mcg by mouth.   Yes Provider, Historical       REVIEW OF SYSTEMS:    Constitutional:  No fever or weight loss  HEENT:  No headache or visual changes  Cardiovascular:  No chest pain, no palpitations.  Respiratory:  No coughing, wheezing, or shortness of breath.  GI:  No abdominal pain. No nausea or vomiting.  No diarrhea  GU:  No hematuria or dysuria. No frequency, retention, urinary incontinence.  Skin:  No rashes or moles  Neuro:  Falls,dizziness.No seizures or syncope  Hematological:  No bruising or bleeding  Endocrine:  No diabetes or thyroid disease  Objective:   VITALS:       Visit Vitals  BP 176/63 (BP 1 Location: Left arm, BP Patient Position: At rest)   Pulse 70   Temp 97.6 ??F (36.4 ??C)   Resp 14   Ht 5' 6"  (1.676 m)   Wt 68.9 kg (151 lb 14.4 oz)   SpO2 94%    BMI 24.52 kg/m??            PHYSICAL EXAM:   General:    Lying in bed in no acute distress.  HEENT:  Pupils equal.  Sclera anicteric.  Conjunctiva pink.  Mucous membranes                           moist  Neck:  Supple.  Trachea midline.  No accessory muscle use.  No thyromegaly.                           No jugular venous distention  CV:                  Regular rate and rhythm.  Lungs:   Clear to auscultation bilaterally.  No Wheezing or Rhonchi. No rales.                           Normal to percussion  Abdomen:   Soft, non-tender. Not distended.  Bowel sounds normal. No organomegaly  Extremities: No cyanosis.  No edema. No clubbing  Neurologic: Alert and oriented X 3.Good sensation.Good power.Moving all extremities without difficulty.    Skin:                Warm and dry.  No rashes.       LAB DATA REVIEWED:    Recent Results (from the past 24 hour(s))   EKG, 12 LEAD, INITIAL    Collection Time: 08/04/17  1:51 PM   Result Value Ref Range    Ventricular Rate 101 BPM    Atrial Rate 105 BPM    QRS Duration 150 ms    Q-T Interval 420 ms    QTC Calculation (Bezet) 544 ms    Calculated R Axis -102 degrees    Calculated T Axis 94 degrees    Diagnosis       Ventricular-paced rhythm  Abnormal ECG    Confirmed by Cammy Copa MD. ([id number]) on 08/04/2017 7:19:23 PM     SAMPLES  BEING HELD    Collection Time: 08/04/17  1:58 PM   Result Value Ref Range    SAMPLES BEING HELD 1RED     COMMENT        Add-on orders for these samples will be processed based on acceptable specimen integrity and analyte stability, which may vary by analyte.   CBC W/O DIFF    Collection Time: 08/04/17  1:58 PM   Result Value Ref Range    WBC 5.8 3.6 - 11.0 K/uL    RBC 4.79 3.80 - 5.20 M/uL    HGB 14.4 11.5 - 16.0 g/dL    HCT 44.8 35.0 - 47.0 %    MCV 93.5 80.0 - 99.0 FL    MCH 30.1 26.0 - 34.0 PG    MCHC 32.1 30.0 - 36.5 g/dL    RDW 18.5 (H) 11.5 - 14.5 %    PLATELET 254 150 - 400 K/uL    MPV 10.4 8.9 - 12.9 FL    NRBC 0.0 0 PER 100 WBC     ABSOLUTE NRBC 0.00 0.00 - 5.28 K/uL   METABOLIC PANEL, COMPREHENSIVE    Collection Time: 08/04/17  1:58 PM   Result Value Ref Range    Sodium 140 136 - 145 mmol/L    Potassium 4.0 3.5 - 5.1 mmol/L    Chloride 101 97 - 108 mmol/L    CO2 29 21 - 32 mmol/L    Anion gap 10 5 - 15 mmol/L    Glucose 94 65 - 100 mg/dL    BUN 20 6 - 20 MG/DL    Creatinine 1.57 (H) 0.55 - 1.02 MG/DL    BUN/Creatinine ratio 13 12 - 20      GFR est AA 38 (L) >60 ml/min/1.2m    GFR est non-AA 31 (L) >60 ml/min/1.736m   Calcium 8.7 8.5 - 10.1 MG/DL    Bilirubin, total 0.6 0.2 - 1.0 MG/DL    ALT (SGPT) 24 12 - 78 U/L    AST (SGOT) 25 15 - 37 U/L    Alk. phosphatase 140 (H) 45 - 117 U/L    Protein, total 6.4 6.4 - 8.2 g/dL    Albumin 3.1 (L) 3.5 - 5.0 g/dL    Globulin 3.3 2.0 - 4.0 g/dL    A-G Ratio 0.9 (L) 1.1 - 2.2     TROPONIN I    Collection Time: 08/04/17  1:58 PM   Result Value Ref Range    Troponin-I, Qt. 0.10 (H) <0.05 ng/mL   PROTHROMBIN TIME + INR    Collection Time: 08/04/17  1:58 PM   Result Value Ref Range    INR 2.7 (H) 0.9 - 1.1      Prothrombin time 26.5 (H) 9.0 - 11.1 sec   PTT    Collection Time: 08/04/17  1:58 PM   Result Value Ref Range    aPTT 27.7 22.1 - 32.0 sec    aPTT, therapeutic range     58.0 - 77.0 SECS   URINALYSIS W/MICROSCOPIC    Collection Time: 08/04/17  3:18 PM   Result Value Ref Range    Color YELLOW/STRAW      Appearance CLEAR CLEAR      Specific gravity 1.010 1.003 - 1.030      pH (UA) 5.5 5.0 - 8.0      Protein NEGATIVE  NEG mg/dL    Glucose NEGATIVE  NEG mg/dL    Ketone NEGATIVE  NEG mg/dL    Bilirubin  NEGATIVE  NEG      Blood NEGATIVE  NEG      Urobilinogen 0.2 0.2 - 1.0 EU/dL    Nitrites NEGATIVE  NEG      Leukocyte Esterase NEGATIVE  NEG      WBC 0-4 0 - 4 /hpf    RBC 0-5 0 - 5 /hpf    Epithelial cells FEW FEW /lpf    Bacteria NEGATIVE  NEG /hpf   URINE CULTURE HOLD SAMPLE    Collection Time: 08/04/17  3:18 PM   Result Value Ref Range    Urine culture hold         URINE ON HOLD IN MICROBIOLOGY DEPT FOR 3 DAYS. IF UNPRESERVED URINE IS SUBMITTED, IT CANNOT BE USED FOR ADDITIONAL TESTING AFTER 24 HRS, RECOLLECTION WILL BE REQUIRED.       Assessment/Plan:   1.Frequent falls   2.Elevated troponin  3.Gout  4.CKD  5.Heart valve disease  6.Pulm hypertension  7.Cardiac arrhythmia  8.S/p pacemaker.    ___________________________________________________  PLAN:    1.Frequent falls/Gait unstability :patient on coumadin    -PT/OT   -CT head w/o acute process.   -Patient recently discharged from rehab facility and lives alone.She will benefit for long term facility.    2.Elevated troponin   -Possibly due to CKD.No report pf chest pain   -Will continue cardiac enzymes.   -Pt is on coumadin.The question is if this will be appropriate for long term with her h/o falls.   -Defer to cardiology for [facility].     3.Gout   -On prednisone and colchicine.Pain meds also prescibed.    4.CKD   -Creatinine at baseline.Will monitor.    5.Cardiac arrhythmia   -S/p pacemaker placement   -Tele-monitoring     5.Heart valve disease  6.Pulm hypertension   -Continue other medications appropriate to her chronic medical problems.    DVT prophylaxis:scds and on coumadin  Code status:full code  Disposition:likely SNF  ___________________________________________________  Admitting Physician: Corliss Parish, MD

## 2017-08-04 NOTE — ED Notes (Signed)
Spoke to son on phone with patient's permission.  Pt states that patient got out of rehab Saturday for a fall, and that he has been staying with the patient at home, but seems to be experiencing caregiver fatigue.

## 2017-08-04 NOTE — ED Notes (Signed)
Updated son, Kristopher GleeChuck Woolman about admission status and assigned room number.  [phone]

## 2017-08-04 NOTE — Other (Signed)
SSED/CM consult received and appreciated. EMR reviewed. [facility] with chief compliant of falls. Patient recently discharged from rehab 6 days ago.     [facility]     Case discussed w/ Dr.Jeffery Mason.     Case Management will follow for transitions of care needs.

## 2017-08-04 NOTE — Progress Notes (Addendum)
1930: Bedside shift change report given to Whitley, Charity fundraiserN (Cabin crJodelle Greenewoncoming nurse) by Zollie Scalelivia, RN (offgoing nurse). Report included the following information SBAR, Kardex, MAR, Recent Results and Cardiac Rhythm V paced with BBB.       Problem: Pressure Injury - Risk of  Goal: *Prevention of pressure injury  Document Braden Scale and appropriate interventions in the flowsheet.  Outcome: Progressing Towards Goal  Pressure Injury Interventions:  Sensory Interventions: Assess changes in LOC, Assess need for specialty bed, Check visual cues for pain, Discuss PT/OT consult with provider, Keep linens dry and wrinkle-free, Maintain/enhance activity level, Minimize linen layers, Pressure redistribution bed/mattress (bed type), Turn and reposition approx. every two hours (pillows and wedges if needed)    Moisture Interventions: Absorbent underpads, Assess need for specialty bed, Check for incontinence Q2 hours and as needed, Internal/External urinary devices, Limit adult briefs, Maintain skin hydration (lotion/cream), Minimize layers, Moisture barrier    Activity Interventions: Increase time out of bed, Pressure redistribution bed/mattress(bed type), PT/OT evaluation, Assess need for specialty bed    Mobility Interventions: Assess need for specialty bed, Float heels, Pressure redistribution bed/mattress (bed type), PT/OT evaluation, Turn and reposition approx. every two hours(pillow and wedges)    Nutrition Interventions: Document food/fluid/supplement intake    Friction and Shear Interventions: Apply protective barrier, creams and emollients, HOB 30 degrees or less, Lift sheet, Lift team/patient mobility team, Minimize layers, Transferring/repositioning devices               Problem: Falls - Risk of  Goal: *Absence of Falls  Document Schmid Fall Risk and appropriate interventions in the flowsheet.  Outcome: Progressing Towards Goal  Fall Risk Interventions:  Mobility Interventions: Communicate number of staff needed for  ambulation/transfer, OT consult for ADLs, Patient to call before getting OOB, PT Consult for mobility concerns, PT Consult for assist device competence, Strengthening exercises (ROM-active/passive), Utilize walker, cane, or other assistive device    Mentation Interventions: Adequate sleep, hydration, pain control, Door open when patient unattended, Evaluate medications/consider consulting pharmacy, Eyeglasses and hearing aids, Increase mobility, More frequent rounding, Reorient patient, Room close to nurse's station, Self-releasing belt, Toileting rounds, Update white board    Medication Interventions: Assess postural VS orthostatic hypotension, Evaluate medications/consider consulting pharmacy, Patient to call before getting OOB, Teach patient to arise slowly    Elimination Interventions: Call light in reach, Patient to call for help with toileting needs, Toilet paper/wipes in reach, Toileting schedule/hourly rounds    History of Falls Interventions: Consult care management for discharge planning, Door open when patient unattended, Evaluate medications/consider consulting pharmacy, Investigate reason for fall, Room close to nurse's station

## 2017-08-04 NOTE — Progress Notes (Addendum)
Problem: Falls - Risk of  Goal: *Absence of Falls  Document Schmid Fall Risk and appropriate interventions in the flowsheet.  Outcome: Progressing Towards Goal  Fall Risk Interventions:  Mobility Interventions: Communicate number of staff needed for ambulation/transfer    Mentation Interventions: Adequate sleep, hydration, pain control    Medication Interventions: Assess postural VS orthostatic hypotension    Elimination Interventions: Call light in reach    History of Falls Interventions: Bed/chair exit alarm        Bedside shift change report given to Marijean NiemannJaime RN (oncoming nurse) by Jodelle GreenWhitley RN (offgoing nurse). Report included the following information SBAR, Kardex, Intake/Output, MAR, Accordion and Recent Results.

## 2017-08-04 NOTE — Progress Notes (Addendum)
TRANSFER - IN REPORT:    Verbal report received from Jenny [name](name) on [name]  being received from [facility] [place] [facility](unit) for routine progression of care      Report consisted of patient???s Situation, Background, Assessment and   Recommendations(SBAR).     Information from the following report(s) SBAR, Kardex, ED Summary, Intake/Output, MAR, Recent Results and Cardiac Rhythm V paced with bundle branch block was reviewed with the receiving nurse.    Opportunity for questions and clarification was provided.      Assessment completed upon patient???s arrival to unit and care assumed.     Primary Nurse Nathanial Millmanlivia B Burton and Trula Orehristina, RN performed a dual skin assessment on this patient Impairment noted- scattered scabs and bruising, scars on knees, reddened toes, bump on left elbow, and red but blanchable bottom.

## 2017-08-04 NOTE — Progress Notes (Signed)
Spiritual Care Assessment/Progress Note  [facility]      NAME: Jenny ColonelMargaret W Blash      MRN: [id number]  AGE: 84 y.o. SEX: female  Religious Affiliation: Other   Language: English     08/04/2017     Total Time (in minutes): 15     Spiritual Assessment begun in [facility] through conversation with:         [x] Patient        []  Family    []  Friend(s)        Reason for Consult: Emergency Department visit     Spiritual beliefs: (Please include comment if needed)     []  Identifies with a faith tradition:         []  Supported by a faith community:            []  Claims no spiritual orientation:           []  Seeking spiritual identity:                []  Adheres to an individual form of spirituality:           [x]  Not able to assess:                           Identified resources for coping:      []  Prayer                               []  Music                  []  Guided Imagery     [x]  Family/friends                 []  Pet visits     []  Devotional reading                         []  Unknown     []  Other:                                               Interventions offered during this visit: (See comments for more details)    Patient Interventions: Affirmation of emotions/emotional suffering, Coping skills reviewed/reinforced, Catharsis/review of pertinent events in supportive environment, Normalization of emotional/spiritual concerns           Plan of Care:     []  Support spiritual and/or cultural needs    []  Support AMD and/or advance care planning process      []  Support grieving process   []  Coordinate Rites and/or Rituals    []  Coordination with community clergy   []  No spiritual needs identified at this time   []  Detailed Plan of Care below (See Comments)  []  Make referral to Music Therapy  []  Make referral to Pet Therapy     []  Make referral to Addiction services  []  [facility]  []  Make referral to Spiritual Care Partner  []  No future visits requested         [x]  Follow up visits as needed     Comments: Chaplain visit in ER. Pt welcomed visit. Pt shared she had just gotten home from a rehab and was feeling better  and had another fall. She mentioned her son who helped here. Pt was grateful for visit and hopes to get back home soon. Chaplain follow up as needed.     [employer], M.Div, MACE   [phone])

## 2017-08-04 NOTE — ED Notes (Signed)
TRANSFER - OUT REPORT:    Verbal report given to Rodena Medinlivia Burton RN(name) on Jenny [name]  being transferred to [facility](unit) for routine progression of care       Report consisted of patient???s Situation, Background, Assessment and   Recommendations(SBAR).     Information from the following report(s) SBAR, Kardex, ED Summary, MAR, Recent Results and Cardiac Rhythm nsr/bbb was reviewed with the receiving nurse.    Lines:   Peripheral IV 08/04/17 Right Antecubital (Active)   Site Assessment Clean, dry, & intact 08/04/2017  2:06 PM   Phlebitis Assessment 0 08/04/2017  2:06 PM   Infiltration Assessment 0 08/04/2017  2:06 PM   Dressing Status Clean, dry, & intact;Occlusive 08/04/2017  2:06 PM   Dressing Type Transparent 08/04/2017  2:06 PM   Hub Color/Line Status Pink;Flushed 08/04/2017  2:06 PM        Opportunity for questions and clarification was provided.      Patient transported with:   [facility]

## 2017-08-04 NOTE — ED Triage Notes (Signed)
TRIAGE NOTE: Arrives via EMS for falls.  Larey SeatFell twice today when she got dizzy.  EMS responded twice, initially refused transport.  Has dementia, c/o lower back pain

## 2017-08-04 NOTE — ED Provider Notes (Addendum)
84yo F presents from home via EMS with cc of fall.  Pt lives alone.  Had 2 falls today for which she called EMS.  She could not get back to her feet.  EMS assisted the first time and she refused transport to ED.  Pt agreed to come after 2nd fall.  She describes falling backward and hitting back of head.  + dizziness.  Pt c/o neck and lower back pain.  Denies vomiting, cp, sob.  Does not take blood thinners.             Past Medical History:   Diagnosis Date   ??? Arrhythmia     atrial Fib   ??? Arrhythmia     SSS   ??? Arthritis    ??? GERD (gastroesophageal reflux disease)    ??? Hypertension    ??? Thyroid disease     hypo       Past Surgical History:   Procedure Laterality Date   ??? ABDOMEN SURGERY PROC UNLISTED  > 30 years ago    partial colectomy; pt not sure why   ??? HX ADENOIDECTOMY     ??? HX ANKLE FRACTURE TX Left 1992    left   ??? HX COLONOSCOPY     ??? HX MOHS PROCEDURES Right 1996    right   ??? HX ORTHOPAEDIC  1994 and 1996    back surgery with rods   ??? HX ORTHOPAEDIC Bilateral     Bil knee replacements ; diff years   ??? HX ORTHOPAEDIC Right 1997    carpal tunnel   ??? HX PACEMAKER     ??? HX TAH AND BSO     ??? HX TONSILLECTOMY           History reviewed. No pertinent family history.    Social History     Socioeconomic History   ??? Marital status: WIDOWED     Spouse name: Not on file   ??? Number of children: Not on file   ??? Years of education: Not on file   ??? Highest education level: Not on file   Social Needs   ??? Financial resource strain: Not on file   ??? Food insecurity - worry: Not on file   ??? Food insecurity - inability: Not on file   ??? Transportation needs - medical: Not on file   ??? Transportation needs - non-medical: Not on file   Occupational History   ??? Not on file   Tobacco Use   ??? Smoking status: Never Smoker   ??? Smokeless tobacco: Never Used   Substance and Sexual Activity   ??? Alcohol use: Yes     Alcohol/week: 3.5 oz     Types: 7 Glasses of wine per week     Comment: with dinner   ??? Drug use: No    ??? Sexual activity: Not on file   Other Topics Concern   ??? Not on file   Social History Narrative   ??? Not on file         ALLERGIES: Iodinated contrast- oral and iv dye; Morphine; Penicillins; Percocet [oxycodone-acetaminophen]; Percodan [oxycodone hcl-oxycodone-asa]; Phenobarbital; Talwin [pentazocine lactate]; and Valium [diazepam]    Review of Systems   Constitutional: Negative for fever.   HENT: Negative for facial swelling.    Eyes: Negative for visual disturbance.   Respiratory: Negative for chest tightness.    Cardiovascular: Negative for chest pain.   Gastrointestinal: Negative for abdominal pain.   Genitourinary: Negative for difficulty urinating  and dysuria.   Musculoskeletal: Negative for arthralgias.   Skin: Negative for rash.   Neurological: Negative for dizziness.   Hematological: Negative for adenopathy.   Psychiatric/Behavioral: Negative for suicidal ideas.       Vitals:    08/04/17 1346   BP: (!) 153/97   Pulse: (!) 101   Resp: 23   Temp: 97.5 ??F (36.4 ??C)   SpO2: 95%            Physical Exam   Constitutional: She is oriented to person, place, and time. She appears well-developed and well-nourished. No distress.   HENT:   Head: Normocephalic and atraumatic.   Eyes: Conjunctivae are normal. Pupils are equal, round, and reactive to light. No scleral icterus.   Neck: Normal range of motion. Neck supple.   Cardiovascular: Normal rate.   No murmur heard.  Pulmonary/Chest: Effort normal. No respiratory distress.   Abdominal: She exhibits no distension.   Musculoskeletal: Normal range of motion. She exhibits no edema.   Neurological: She is alert and oriented to person, place, and time.   Skin: Skin is warm and dry. No rash noted.   Nursing note and vitals reviewed.       MDM  Number of Diagnoses or Management Options  Anticoagulant long-term use:   Elevated troponin:   Falls frequently:   Injury of head, initial encounter:        ED EKG interpretation:   Rhythm: v-paced. Rate (approx.): 101.  Axis: normal.  ST segment:  No concerning ST elevations or depressions. This EKG was interpreted by Domingo CockingJeffrey T Perle Brickhouse, MD,ED Provider.    CT head and c-spine unremarkable.  Trop 0.1  INR 2.7    Hospitalist TigerText for Admission  2:51 PM    ED Room Number: SER07/07  Patient Name and age:  Jenny [name] 84 y.o.  female  Working Diagnosis:   1. Falls frequently    2. Injury of head, initial encounter    3. Elevated troponin    4. Anticoagulant long-term use      Readmission: no  Isolation Requirements:  no  Recommended Level of Care:  med/surg  Code Status:  Full Code  Other:  Discharged from rehab 6 days ago.  2 falls today.  Trop 0.1.  Lives at home alone.    Dr. Christie BeckersMathur notified via TigerText.    Procedures

## 2017-08-05 ENCOUNTER — Inpatient Hospital Stay: Admit: 2017-08-05 | Payer: MEDICARE | Primary: Internal Medicine

## 2017-08-05 LAB — TRANSTHORACIC ECHOCARDIOGRAM (TTE) COMPLETE (CONTRAST/BUBBLE/3D PRN)
AR Max Velocity PISA: 415.24 cm/s
AR PHT: 509.1 cm
AV Area by Peak Velocity: 0.8 cm2
AV Mean Gradient: 10.3 mmHg
AV Peak Gradient: 16.3 mmHg
AV Peak Velocity: 201.8 cm/s
AV VTI: 38.63 cm
E/E' Lateral: 0.1
E/E' Ratio (Averaged): 0.12
E/E' Septal: 0.15
IVSd: 0.84 cm (ref 0.6–0.9)
LA Area 4C: 23.3 cm2
LA Major Axis: 4.54 cm
LA Volume 2C: 114.18 mL — AB (ref 22–52)
LA Volume 4C: 59.85 mL — AB (ref 22–52)
LA Volume BP: 91.73 mL — AB (ref 22–52)
LA Volume Index 2C: 64.33 ml/m2 (ref 16–28)
LA Volume Index 4C: 33.72 ml/m2 (ref 16–28)
LA Volume Index BP: 51.68 ml/m2 (ref 16–28)
LV E' Lateral Velocity: 10.28 cm/s
LV E' Septal Velocity: 6.57 cm/s
LV Mass 2D Index: 83.3 g/m2 (ref 43–95)
LV Mass 2D: 147.9 g (ref 67–162)
LVIDd: 4.28 cm (ref 3.9–5.3)
LVIDs: 2.93 cm
LVOT Diameter: 1.89 cm
LVOT Peak Gradient: 1.4 mmHg
LVOT Peak Velocity: 60.08 cm/s
LVPWd: 1.04 cm — AB (ref 0.6–0.9)
Left Ventricular Ejection Fraction: 58
MV A Velocity: 26.07 cm/s
MV Area by PHT: 3.7 cm2
MV E Velocity: 0.99 cm/s
MV E Wave Deceleration Time: 204.6 ms
MV E/A: 0.04
MV PHT: 59.3 ms
PASP: 55 mmHg
RVIDd: 3.36 cm
RVOT Peak Velocity: 59.35 cm/s
TAPSE: 1.76 cm (ref 1.5–2)
TR Max Velocity: 351.19 cm/s
TR Peak Gradient: 49.3 mmHg

## 2017-08-05 LAB — EKG 12-LEAD
Atrial Rate: 105 {beats}/min
Q-T Interval: 420 ms
QRS Duration: 150 ms
QTc Calculation (Bazett): 544 ms
R Axis: -102 degrees
T Axis: 94 degrees
Ventricular Rate: 101 {beats}/min

## 2017-08-05 LAB — ECHO ADULT COMPLETE
AR Max Vel: 415.24 cm/s
AR PHT: 509.1 cm
AV Area by Peak Velocity: 0.8 cm2
AV Mean Gradient: 10.3 mmHg
AV Peak Gradient: 16.3 mmHg
AV Peak Velocity: 201.8 cm/s
AV VTI: 38.63 cm
E/E' Lateral: 0.1
E/E' Ratio (Averaged): 0.12
E/E' Septal: 0.15
IVSd: 0.84 cm (ref 0.6–0.9)
LA Area 4C: 23.3 cm2
LA Major Axis: 4.54 cm
LA Volume 2C: 114.18 mL — AB (ref 22–52)
LA Volume 4C: 59.85 mL — AB (ref 22–52)
LA Volume BP: 91.73 mL — AB (ref 22–52)
LA Volume Index 2C: 64.33 ml/m2 (ref 16–28)
LA Volume Index 4C: 33.72 ml/m2 (ref 16–28)
LA Volume Index BP: 51.68 ml/m2 (ref 16–28)
LV E' Lateral Velocity: 10.28 cm/s
LV E' Septal Velocity: 6.57 cm/s
LV Mass 2D Index: 83.3 g/m2 (ref 43–95)
LV Mass 2D: 147.9 g (ref 67–162)
LVIDd: 4.28 cm (ref 3.9–5.3)
LVIDs: 2.93 cm
LVOT Diameter: 1.89 cm
LVOT Peak Gradient: 1.4 mmHg
LVOT Peak Velocity: 60.08 cm/s
LVPWd: 1.04 cm — AB (ref 0.6–0.9)
MV A Velocity: 26.07 cm/s
MV Area by PHT: 3.7 cm2
MV E Velocity: 0.99 cm/s
MV E Wave Deceleration Time: 204.6 ms
MV E/A: 0.04
MV PHT: 59.3 ms
PASP: 55 mmHg
RVIDd: 3.36 cm
RVOT Peak Velocity: 59.35 cm/s
TAPSE: 1.76 cm (ref 1.5–2.0)
TR Max Velocity: 351.19 cm/s
TR Peak Gradient: 49.3 mmHg

## 2017-08-05 LAB — EKG, 12 LEAD, INITIAL
Atrial Rate: 105 {beats}/min
Calculated R Axis: -102 degrees
Calculated T Axis: 94 degrees
Q-T Interval: 420 ms
QRS Duration: 150 ms
QTC Calculation (Bezet): 544 ms
Ventricular Rate: 101 {beats}/min

## 2017-08-05 LAB — LIPID PANEL
CHOL/HDL Ratio: 3 (ref 0–5.0)
Cholesterol, total: 191 MG/DL (ref <200)
HDL Cholesterol: 64 MG/DL
LDL, calculated: 101.6 MG/DL — ABNORMAL HIGH (ref 0–100)
Triglyceride: 127 MG/DL (ref <150)
VLDL, calculated: 25.4 MG/DL

## 2017-08-05 LAB — METABOLIC PANEL, COMPREHENSIVE
A-G Ratio: 1 — ABNORMAL LOW (ref 1.1–2.2)
ALT (SGPT): 17 U/L (ref 12–78)
AST (SGOT): 25 U/L (ref 15–37)
Albumin: 2.7 g/dL — ABNORMAL LOW (ref 3.5–5.0)
Alk. phosphatase: 106 U/L (ref 45–117)
Anion gap: 11 mmol/L (ref 5–15)
BUN/Creatinine ratio: 15 (ref 12–20)
BUN: 22 MG/DL — ABNORMAL HIGH (ref 6–20)
Bilirubin, total: 0.6 MG/DL (ref 0.2–1.0)
CO2: 25 mmol/L (ref 21–32)
Calcium: 8.2 MG/DL — ABNORMAL LOW (ref 8.5–10.1)
Chloride: 103 mmol/L (ref 97–108)
Creatinine: 1.45 MG/DL — ABNORMAL HIGH (ref 0.55–1.02)
GFR est AA: 42 mL/min/{1.73_m2} — ABNORMAL LOW (ref >60)
GFR est non-AA: 34 mL/min/{1.73_m2} — ABNORMAL LOW (ref >60)
Globulin: 2.8 g/dL (ref 2.0–4.0)
Glucose: 102 mg/dL — ABNORMAL HIGH (ref 65–100)
Potassium: 3.9 mmol/L (ref 3.5–5.1)
Protein, total: 5.5 g/dL — ABNORMAL LOW (ref 6.4–8.2)
Sodium: 139 mmol/L (ref 136–145)

## 2017-08-05 LAB — CBC WITH AUTOMATED DIFF
ABS. BASOPHILS: 0 10*3/uL (ref 0.0–0.1)
ABS. EOSINOPHILS: 0.1 10*3/uL (ref 0.0–0.4)
ABS. IMM. GRANS.: 0 10*3/uL (ref 0.00–0.04)
ABS. LYMPHOCYTES: 0.7 10*3/uL — ABNORMAL LOW (ref 0.8–3.5)
ABS. MONOCYTES: 0.6 10*3/uL (ref 0.0–1.0)
ABS. NEUTROPHILS: 3.7 10*3/uL (ref 1.8–8.0)
ABSOLUTE NRBC: 0 10*3/uL (ref 0.00–0.01)
BASOPHILS: 0 % (ref 0–1)
EOSINOPHILS: 2 % (ref 0–7)
HCT: 42.7 % (ref 35.0–47.0)
HGB: 13.4 g/dL (ref 11.5–16.0)
IMMATURE GRANULOCYTES: 0 % (ref 0.0–0.5)
LYMPHOCYTES: 14 % (ref 12–49)
MCH: 30.3 PG (ref 26.0–34.0)
MCHC: 31.4 g/dL (ref 30.0–36.5)
MCV: 96.6 FL (ref 80.0–99.0)
MONOCYTES: 11 % (ref 5–13)
MPV: 10.5 FL (ref 8.9–12.9)
NEUTROPHILS: 73 % (ref 32–75)
NRBC: 0 PER 100 WBC
PLATELET: 235 10*3/uL (ref 150–400)
RBC: 4.42 M/uL (ref 3.80–5.20)
RDW: 18.6 % — ABNORMAL HIGH (ref 11.5–14.5)
WBC: 5.1 10*3/uL (ref 3.6–11.0)

## 2017-08-05 LAB — PROTHROMBIN TIME + INR
INR: 3.1 — ABNORMAL HIGH (ref 0.9–1.1)
Prothrombin time: 29.9 s — ABNORMAL HIGH (ref 9.0–11.1)

## 2017-08-05 LAB — TROPONIN I
Troponin-I, Qt.: 0.1 ng/mL — ABNORMAL HIGH (ref <0.05)
Troponin-I, Qt.: 0.1 ng/mL — ABNORMAL HIGH (ref <0.05)

## 2017-08-05 MED ORDER — AMLODIPINE 5 MG TAB
5 mg | Freq: Every day | ORAL | Status: DC
Start: 2017-08-05 — End: 2017-08-08
  Administered 2017-08-05 – 2017-08-08 (×4): via ORAL

## 2017-08-05 MED ORDER — PREDNISONE 5 MG TAB
5 mg | Freq: Every day | ORAL | Status: DC
Start: 2017-08-05 — End: 2017-08-12
  Administered 2017-08-05 – 2017-08-12 (×8): via ORAL

## 2017-08-05 MED ORDER — DOCUSATE SODIUM 100 MG CAP
100 mg | Freq: Every day | ORAL | Status: DC
Start: 2017-08-05 — End: 2017-08-09
  Administered 2017-08-05 – 2017-08-09 (×5): via ORAL

## 2017-08-05 MED ORDER — DULOXETINE 60 MG CAP, DELAYED RELEASE
60 mg | Freq: Every day | ORAL | Status: DC
Start: 2017-08-05 — End: 2017-08-12
  Administered 2017-08-05 – 2017-08-12 (×8): via ORAL

## 2017-08-05 MED ORDER — ONDANSETRON (PF) 4 MG/2 ML INJECTION
4 mg/2 mL | INTRAMUSCULAR | Status: DC | PRN
Start: 2017-08-05 — End: 2017-08-12

## 2017-08-05 MED ORDER — WARFARIN 1 MG TAB
1 mg | Freq: Every day | ORAL | Status: DC
Start: 2017-08-05 — End: 2017-08-05

## 2017-08-05 MED ORDER — SODIUM CHLORIDE 0.9 % IJ SYRG
INTRAMUSCULAR | Status: DC | PRN
Start: 2017-08-05 — End: 2017-08-12

## 2017-08-05 MED ORDER — HYDROCODONE-ACETAMINOPHEN 5 MG-325 MG TAB
5-325 mg | ORAL | Status: DC | PRN
Start: 2017-08-05 — End: 2017-08-12
  Administered 2017-08-05 – 2017-08-12 (×20): via ORAL

## 2017-08-05 MED ORDER — COLCHICINE 0.6 MG TAB
0.6 mg | Freq: Every day | ORAL | Status: DC
Start: 2017-08-05 — End: 2017-08-12
  Administered 2017-08-05 – 2017-08-12 (×8): via ORAL

## 2017-08-05 MED ORDER — SODIUM CHLORIDE 0.9 % IJ SYRG
Freq: Three times a day (TID) | INTRAMUSCULAR | Status: DC
Start: 2017-08-05 — End: 2017-08-12
  Administered 2017-08-05 – 2017-08-12 (×22): via INTRAVENOUS

## 2017-08-05 MED ORDER — PHARMACY WARFARIN NOTE
Status: DC
Start: 2017-08-05 — End: 2017-08-05

## 2017-08-05 MED ORDER — LEVOTHYROXINE 88 MCG TAB
88 mcg | Freq: Every day | ORAL | Status: DC
Start: 2017-08-05 — End: 2017-08-12
  Administered 2017-08-05 – 2017-08-12 (×8): via ORAL

## 2017-08-05 MED ORDER — .PHARMACY TO SUBSTITUTE PER PROTOCOL
Status: DC | PRN
Start: 2017-08-05 — End: 2017-08-05

## 2017-08-05 MED FILL — COLCHICINE 0.6 MG TAB: 0.6 mg | ORAL | Qty: 1

## 2017-08-05 MED FILL — HYDROCODONE-ACETAMINOPHEN 5 MG-325 MG TAB: 5-325 mg | ORAL | Qty: 1

## 2017-08-05 MED FILL — NORMAL SALINE FLUSH 0.9 % INJECTION SYRINGE: INTRAMUSCULAR | Qty: 10

## 2017-08-05 MED FILL — PREDNISONE 5 MG TAB: 5 mg | ORAL | Qty: 1

## 2017-08-05 MED FILL — AMLODIPINE 5 MG TAB: 5 mg | ORAL | Qty: 1

## 2017-08-05 MED FILL — NORMAL SALINE FLUSH 0.9 % INJECTION SYRINGE: INTRAMUSCULAR | Qty: 20

## 2017-08-05 MED FILL — DULOXETINE 60 MG CAP, DELAYED RELEASE: 60 mg | ORAL | Qty: 1

## 2017-08-05 MED FILL — PHARMACY WARFARIN NOTE: Qty: 1

## 2017-08-05 MED FILL — DOK 100 MG CAPSULE: 100 mg | ORAL | Qty: 1

## 2017-08-05 MED FILL — LEVOTHYROXINE 88 MCG TAB: 88 mcg | ORAL | Qty: 1

## 2017-08-05 NOTE — Consults (Signed)
Cardiology Consult Note    CC: falls  Reason for consult:  Presyncope/elevated troponins  Requesting MD:  Dr. Laure Kidney     Subjective:      Date of  Admission: 08/04/2017  1:38 PM     Admission type:Emergency    Jenny [name] is a 84 y.o. female admitted for Falls  Frequent falls.Patient complains of falling at home. She fell recently and was admitted to [facility] where she was diagnosed with new gout in her feet. She went to rehab and then home about one week ago. Yesterday she fell as she tried to get her clothes. There was dizziness prior to fall. She walks with a cane and ambulation is much slower with her gout. Denies any CP or SOB or palpitations. Her troponin was elevated at 0.1. EKG shows paced rhythm. Her past cardiac diagnosis include PAF, SSS, s/p pacer, & pulmonary HTN. She denies any recent failure sx. Her pacer was placed 9 years ago and had a recent normal checkup of it.    Patient Active Problem List    Diagnosis Date Noted   ??? Pulmonary hypertension ([place]) 11/21/2011     Priority: 1 - One   ??? Falls 08/04/2017   ??? Elevated troponin 08/04/2017   ??? Gout 08/04/2017   ??? CKD (chronic kidney disease) 08/04/2017   ??? Heart valve disease 08/04/2017   ??? Frequent falls 08/04/2017   ??? Gait instability 08/04/2017   ??? Anticoagulated on Coumadin 08/04/2017      Jenny Record, MD  Past Medical History:   Diagnosis Date   ??? Arrhythmia     atrial Fib   ??? Arrhythmia     SSS   ??? Arthritis    ??? GERD (gastroesophageal reflux disease)    ??? Hypertension    ??? Thyroid disease     hypo      Past Surgical History:   Procedure Laterality Date   ??? ABDOMEN SURGERY PROC UNLISTED  > 30 years ago    partial colectomy; pt not sure why   ??? HX ADENOIDECTOMY     ??? HX ANKLE FRACTURE TX Left 1992    left   ??? HX COLONOSCOPY     ??? HX MOHS PROCEDURES Right 1996    right   ??? HX ORTHOPAEDIC  1994 and 1996    back surgery with rods   ??? HX ORTHOPAEDIC Bilateral     Bil knee replacements ; diff years   ??? HX ORTHOPAEDIC Right 1997    carpal tunnel   ??? HX  PACEMAKER     ??? HX TAH AND BSO     ??? HX TONSILLECTOMY       Allergies   Allergen Reactions   ??? Iodinated Contrast- Oral And Iv Dye Itching   ??? Morphine Other (comments)     Migraine headaches severe   ??? Penicillins Rash   ??? Percocet [Oxycodone-Acetaminophen] Other (comments)     Starts to see things that are not there.   ??? Percodan [Oxycodone Hcl-Oxycodone-Asa] Other (comments)     "sends me in ORBIT   ??? Phenobarbital Unknown (comments)   ??? Talwin [Pentazocine Lactate] Unknown (comments)     Long time ago   ??? Valium [Diazepam] Rash      History reviewed. No pertinent family history.   Current Facility-Administered Medications   Medication Dose Route Frequency   ??? sodium chloride (NS) flush 5-40 mL  5-40 mL IntraVENous Q8H   ??? sodium chloride (NS) flush 5-40  mL  5-40 mL IntraVENous PRN   ??? amLODIPine (NORVASC) tablet 2.5 mg  2.5 mg Oral DAILY   ??? colchicine tablet 0.6 mg  0.6 mg Oral DAILY   ??? DULoxetine (CYMBALTA) capsule 60 mg  60 mg Oral DAILY   ??? HYDROcodone-acetaminophen (NORCO) 5-325 mg per tablet 1 Tab  1 Tab Oral Q4H PRN   ??? levothyroxine (SYNTHROID) tablet 88 mcg  88 mcg Oral ACB   ??? predniSONE (DELTASONE) tablet 5 mg  5 mg Oral DAILY WITH BREAKFAST   ??? warfarin (COUMADIN) tablet 1 mg  1 mg Oral DAILY   ??? sodium chloride (NS) flush 5-40 mL  5-40 mL IntraVENous Q8H   ??? sodium chloride (NS) flush 5-40 mL  5-40 mL IntraVENous PRN   ??? ondansetron (ZOFRAN) injection 4 mg  4 mg IntraVENous Q4H PRN   ??? Warfarin:  dosing per MD   Other Rx Dosing/Monitoring        Prior to Admission Medications:  Prior to Admission medications    Medication Sig Start Date End Date Taking? Authorizing Provider   predniSONE (DELTASONE) 5 mg tablet Take 5 mg by mouth.   Yes Other, Phys, MD   AMLODIPINE BESYLATE, BULK, by Does Not Apply route.   Yes Other, Phys, MD   amLODIPine (NORVASC) 2.5 mg tablet Take 2.5 mg by mouth daily.   Yes Other, Phys, MD   colchicine (COLCRYS) 0.6 mg tablet Take 0.6 mg by mouth daily.   Yes Other, Phys, MD    azithromycin (ZITHROMAX) 250 mg tablet Take 250 mg by mouth daily.   Yes Other, Phys, MD   cefUROXime (CEFTIN) 500 mg tablet Take 500 mg by mouth two (2) times a day.   Yes Other, Phys, MD   diclofenac (VOLTAREN) 1 % gel Apply 1 g to affected area.   Yes Other, Phys, MD   HYDROcodone-acetaminophen (NORCO) 5-325 mg per tablet Take 1 Tab by mouth.   Yes Other, Phys, MD   DULoxetine (CYMBALTA) 60 mg capsule Take 60 mg by mouth.   Yes Provider, Historical   warfarin (COUMADIN) 1 mg tablet Take 1 mg by mouth daily.   Yes Provider, Historical   levothyroxine (SYNTHROID) 88 mcg tablet Take 88 mcg by mouth.   Yes Provider, Historical        Review of Symptoms:  Except as noted in HPI, patient denies recent fever or chills, nausea, vomiting, diarrhea, hemoptysis, hematemesis, dysuria, myalgias, focal neurologic symptoms, ecchymosis, angioedema, odynophagia, dysphagia, sore throat, earache,rash, melena, hematochezia, depression, GERD, cold intolerance, petechia, bleeding gums, or significant weight loss.    A comprehensive review of systems was negative except for that written in the HPI.     Subjective:    24 hr VS reviewed, overall VSSAF  Temp (24hrs), Avg:97.7 ??F (36.5 ??C), Min:97.3 ??F (36.3 ??C), Max:98.1 ??F (36.7 ??C)    Patient Vitals for the past 8 hrs:   Pulse   08/05/17 0759 69   08/05/17 0304 76    Patient Vitals for the past 8 hrs:   Resp   08/05/17 0759 19   08/05/17 0304 20    Patient Vitals for the past 8 hrs:   BP   08/05/17 0759 130/72   08/05/17 0304 127/47          Intake/Output Summary (Last 24 hours) at 08/05/2017 0907  Last data filed at 08/05/2017 0305  Gross per 24 hour   Intake 240 ml   Output 325 ml   Net -85  ml         Physical Exam (complete single organ system exam)      Visit Vitals  BP 130/72 (BP 1 Location: Left arm, BP Patient Position: At rest)   Pulse 69   Temp 97.3 ??F (36.3 ??C)   Resp 19   Ht 5' 6"  (1.676 m)   Wt 154 lb (69.9 kg)   SpO2 95%   BMI 24.86 kg/m??     General Appearance:  Well  developed, well nourished,alert and oriented x 3, and individual in no acute distress.   Ears/Nose/Mouth/Throat:   Hearing grossly normal.         Neck: Supple.   Chest:   Lungs clear to auscultation bilaterally.   Cardiovascular:  Regular rate and rhythm, S1, S2 normal, no murmur.   Abdomen:   Soft, non-tender, bowel sounds are active.   Extremities: No edema bilaterally.    Skin: Warm and dry.               Cardiographics    Telemetry: normal sinus rhythm, AV paced  ECG: sinus with V paced breats  Echocardiogram: Not done    Labs:   Recent Results (from the past 24 hour(s))   EKG, 12 LEAD, INITIAL    Collection Time: 08/04/17  1:51 PM   Result Value Ref Range    Ventricular Rate 101 BPM    Atrial Rate 105 BPM    QRS Duration 150 ms    Q-T Interval 420 ms    QTC Calculation (Bezet) 544 ms    Calculated R Axis -102 degrees    Calculated T Axis 94 degrees    Diagnosis       Ventricular-paced rhythm  Abnormal ECG    Confirmed by Cammy Copa MD. ([id number]) on 08/04/2017 7:19:23 PM     SAMPLES BEING HELD    Collection Time: 08/04/17  1:58 PM   Result Value Ref Range    SAMPLES BEING HELD 1RED     COMMENT        Add-on orders for these samples will be processed based on acceptable specimen integrity and analyte stability, which may vary by analyte.   CBC W/O DIFF    Collection Time: 08/04/17  1:58 PM   Result Value Ref Range    WBC 5.8 3.6 - 11.0 K/uL    RBC 4.79 3.80 - 5.20 M/uL    HGB 14.4 11.5 - 16.0 g/dL    HCT 44.8 35.0 - 47.0 %    MCV 93.5 80.0 - 99.0 FL    MCH 30.1 26.0 - 34.0 PG    MCHC 32.1 30.0 - 36.5 g/dL    RDW 18.5 (H) 11.5 - 14.5 %    PLATELET 254 150 - 400 K/uL    MPV 10.4 8.9 - 12.9 FL    NRBC 0.0 0 PER 100 WBC    ABSOLUTE NRBC 0.00 0.00 - 8.54 K/uL   METABOLIC PANEL, COMPREHENSIVE    Collection Time: 08/04/17  1:58 PM   Result Value Ref Range    Sodium 140 136 - 145 mmol/L    Potassium 4.0 3.5 - 5.1 mmol/L    Chloride 101 97 - 108 mmol/L    CO2 29 21 - 32 mmol/L    Anion gap 10 5 - 15 mmol/L    Glucose  94 65 - 100 mg/dL    BUN 20 6 - 20 MG/DL    Creatinine 1.57 (H) 0.55 - 1.02 MG/DL  BUN/Creatinine ratio 13 12 - 20      GFR est AA 38 (L) >60 ml/min/1.67m    GFR est non-AA 31 (L) >60 ml/min/1.735m   Calcium 8.7 8.5 - 10.1 MG/DL    Bilirubin, total 0.6 0.2 - 1.0 MG/DL    ALT (SGPT) 24 12 - 78 U/L    AST (SGOT) 25 15 - 37 U/L    Alk. phosphatase 140 (H) 45 - 117 U/L    Protein, total 6.4 6.4 - 8.2 g/dL    Albumin 3.1 (L) 3.5 - 5.0 g/dL    Globulin 3.3 2.0 - 4.0 g/dL    A-G Ratio 0.9 (L) 1.1 - 2.2     TROPONIN I    Collection Time: 08/04/17  1:58 PM   Result Value Ref Range    Troponin-I, Qt. 0.10 (H) <0.05 ng/mL   PROTHROMBIN TIME + INR    Collection Time: 08/04/17  1:58 PM   Result Value Ref Range    INR 2.7 (H) 0.9 - 1.1      Prothrombin time 26.5 (H) 9.0 - 11.1 sec   PTT    Collection Time: 08/04/17  1:58 PM   Result Value Ref Range    aPTT 27.7 22.1 - 32.0 sec    aPTT, therapeutic range     58.0 - 77.0 SECS   URINALYSIS W/MICROSCOPIC    Collection Time: 08/04/17  3:18 PM   Result Value Ref Range    Color YELLOW/STRAW      Appearance CLEAR CLEAR      Specific gravity 1.010 1.003 - 1.030      pH (UA) 5.5 5.0 - 8.0      Protein NEGATIVE  NEG mg/dL    Glucose NEGATIVE  NEG mg/dL    Ketone NEGATIVE  NEG mg/dL    Bilirubin NEGATIVE  NEG      Blood NEGATIVE  NEG      Urobilinogen 0.2 0.2 - 1.0 EU/dL    Nitrites NEGATIVE  NEG      Leukocyte Esterase NEGATIVE  NEG      WBC 0-4 0 - 4 /hpf    RBC 0-5 0 - 5 /hpf    Epithelial cells FEW FEW /lpf    Bacteria NEGATIVE  NEG /hpf   URINE CULTURE HOLD SAMPLE    Collection Time: 08/04/17  3:18 PM   Result Value Ref Range    Urine culture hold        URINE ON HOLD IN MICROBIOLOGY DEPT FOR 3 DAYS. IF UNPRESERVED URINE IS SUBMITTED, IT CANNOT BE USED FOR ADDITIONAL TESTING AFTER 24 HRS, RECOLLECTION WILL BE REQUIRED.   TROPONIN I    Collection Time: 08/04/17  8:22 PM   Result Value Ref Range    Troponin-I, Qt. 0.10 (H) <0<0.63g/mL   METABOLIC PANEL, COMPREHENSIVE    Collection  Time: 08/05/17  1:45 AM   Result Value Ref Range    Sodium 139 136 - 145 mmol/L    Potassium 3.9 3.5 - 5.1 mmol/L    Chloride 103 97 - 108 mmol/L    CO2 25 21 - 32 mmol/L    Anion gap 11 5 - 15 mmol/L    Glucose 102 (H) 65 - 100 mg/dL    BUN 22 (H) 6 - 20 MG/DL    Creatinine 1.45 (H) 0.55 - 1.02 MG/DL    BUN/Creatinine ratio 15 12 - 20      GFR est AA 42 (L) >60 ml/min/1.7324m  GFR est non-AA 34 (L) >60 ml/min/1.68m    Calcium 8.2 (L) 8.5 - 10.1 MG/DL    Bilirubin, total 0.6 0.2 - 1.0 MG/DL    ALT (SGPT) 17 12 - 78 U/L    AST (SGOT) 25 15 - 37 U/L    Alk. phosphatase 106 45 - 117 U/L    Protein, total 5.5 (L) 6.4 - 8.2 g/dL    Albumin 2.7 (L) 3.5 - 5.0 g/dL    Globulin 2.8 2.0 - 4.0 g/dL    A-G Ratio 1.0 (L) 1.1 - 2.2     LIPID PANEL    Collection Time: 08/05/17  1:45 AM   Result Value Ref Range    LIPID PROFILE          Cholesterol, total 191 <200 MG/DL    Triglyceride 127 <150 MG/DL    HDL Cholesterol 64 MG/DL    LDL, calculated 101.6 (H) 0 - 100 MG/DL    VLDL, calculated 25.4 MG/DL    CHOL/HDL Ratio 3.0 0 - 5.0     CBC WITH AUTOMATED DIFF    Collection Time: 08/05/17  1:45 AM   Result Value Ref Range    WBC 5.1 3.6 - 11.0 K/uL    RBC 4.42 3.80 - 5.20 M/uL    HGB 13.4 11.5 - 16.0 g/dL    HCT 42.7 35.0 - 47.0 %    MCV 96.6 80.0 - 99.0 FL    MCH 30.3 26.0 - 34.0 PG    MCHC 31.4 30.0 - 36.5 g/dL    RDW 18.6 (H) 11.5 - 14.5 %    PLATELET 235 150 - 400 K/uL    MPV 10.5 8.9 - 12.9 FL    NRBC 0.0 0 PER 100 WBC    ABSOLUTE NRBC 0.00 0.00 - 0.01 K/uL    NEUTROPHILS 73 32 - 75 %    LYMPHOCYTES 14 12 - 49 %    MONOCYTES 11 5 - 13 %    EOSINOPHILS 2 0 - 7 %    BASOPHILS 0 0 - 1 %    IMMATURE GRANULOCYTES 0 0.0 - 0.5 %    ABS. NEUTROPHILS 3.7 1.8 - 8.0 K/UL    ABS. LYMPHOCYTES 0.7 (L) 0.8 - 3.5 K/UL    ABS. MONOCYTES 0.6 0.0 - 1.0 K/UL    ABS. EOSINOPHILS 0.1 0.0 - 0.4 K/UL    ABS. BASOPHILS 0.0 0.0 - 0.1 K/UL    ABS. IMM. GRANS. 0.0 0.00 - 0.04 K/UL    DF SMEAR SCANNED      PLATELET COMMENTS Large Platelets      RBC  COMMENTS ANISOCYTOSIS  1+        RBC COMMENTS OVALOCYTES  PRESENT       PROTHROMBIN TIME + INR    Collection Time: 08/05/17  1:45 AM   Result Value Ref Range    INR 3.1 (H) 0.9 - 1.1      Prothrombin time 29.9 (H) 9.0 - 11.1 sec   TROPONIN I    Collection Time: 08/05/17  1:45 AM   Result Value Ref Range    Troponin-I, Qt. 0.10 (H) <0.05 ng/mL        Assessment:     Assessment:   Falls; frequent; need to check for arrhythmia or ischemia; orthostatic hypotension is also a possibility  Elevated troponins; due to most likely demand ischemia but need to rule out objective ischemia and CAD  SSS; s/p pacer  H/o PAF  Chronic anticoagulation  Plan:   Tele  Interrogate pacer for arrhythmia  Orthostatic BP checks  Echo  Needs a chemical stress test on Monday    Vicenta Aly, MD

## 2017-08-05 NOTE — Consults (Signed)
Cardiology Consult Note    CC: falls  Reason for consult:  Presyncope/elevated troponins  Requesting MD:  Dr. Laure Kidney     Subjective:      Date of  Admission: 08/04/2017  1:38 PM     Admission type:Emergency    Jenny [name] is a 84 y.o. female admitted for Falls  Frequent falls.Patient complains of falling at home. She fell recently and was admitted to [facility] where she was diagnosed with new gout in her feet. She went to rehab and then home about one week ago. Yesterday she fell as she tried to get her clothes. There was dizziness prior to fall. She walks with a cane and ambulation is much slower with her gout. Denies any CP or SOB or palpitations. Her troponin was elevated at 0.1. EKG shows paced rhythm. Her past cardiac diagnosis include PAF, SSS, s/p pacer, & pulmonary HTN. She denies any recent failure sx. Her pacer was placed 9 years ago and had a recent normal checkup of it.    Patient Active Problem List    Diagnosis Date Noted   ??? Pulmonary hypertension ([place]) 11/21/2011     Priority: 1 - One   ??? Falls 08/04/2017   ??? Elevated troponin 08/04/2017   ??? Gout 08/04/2017   ??? CKD (chronic kidney disease) 08/04/2017   ??? Heart valve disease 08/04/2017   ??? Frequent falls 08/04/2017   ??? Gait instability 08/04/2017   ??? Anticoagulated on Coumadin 08/04/2017      Laurice Record, MD  Past Medical History:   Diagnosis Date   ??? Arrhythmia     atrial Fib   ??? Arrhythmia     SSS   ??? Arthritis    ??? GERD (gastroesophageal reflux disease)    ??? Hypertension    ??? Thyroid disease     hypo      Past Surgical History:   Procedure Laterality Date   ??? ABDOMEN SURGERY PROC UNLISTED  > 30 years ago    partial colectomy; pt not sure why   ??? HX ADENOIDECTOMY     ??? HX ANKLE FRACTURE TX Left 1992    left   ??? HX COLONOSCOPY     ??? HX MOHS PROCEDURES Right 1996    right   ??? HX ORTHOPAEDIC  1994 and 1996    back surgery with rods   ??? HX ORTHOPAEDIC Bilateral     Bil knee replacements ; diff years   ??? HX ORTHOPAEDIC Right 1997    carpal tunnel    ??? HX PACEMAKER     ??? HX TAH AND BSO     ??? HX TONSILLECTOMY       Allergies   Allergen Reactions   ??? Iodinated Contrast- Oral And Iv Dye Itching   ??? Morphine Other (comments)     Migraine headaches severe   ??? Penicillins Rash   ??? Percocet [Oxycodone-Acetaminophen] Other (comments)     Starts to see things that are not there.   ??? Percodan [Oxycodone Hcl-Oxycodone-Asa] Other (comments)     "sends me in ORBIT   ??? Phenobarbital Unknown (comments)   ??? Talwin [Pentazocine Lactate] Unknown (comments)     Long time ago   ??? Valium [Diazepam] Rash      History reviewed. No pertinent family history.   Current Facility-Administered Medications   Medication Dose Route Frequency   ??? sodium chloride (NS) flush 5-40 mL  5-40 mL IntraVENous Q8H   ??? sodium chloride (NS) flush 5-40  mL  5-40 mL IntraVENous PRN   ??? amLODIPine (NORVASC) tablet 2.5 mg  2.5 mg Oral DAILY   ??? colchicine tablet 0.6 mg  0.6 mg Oral DAILY   ??? DULoxetine (CYMBALTA) capsule 60 mg  60 mg Oral DAILY   ??? HYDROcodone-acetaminophen (NORCO) 5-325 mg per tablet 1 Tab  1 Tab Oral Q4H PRN   ??? levothyroxine (SYNTHROID) tablet 88 mcg  88 mcg Oral ACB   ??? predniSONE (DELTASONE) tablet 5 mg  5 mg Oral DAILY WITH BREAKFAST   ??? warfarin (COUMADIN) tablet 1 mg  1 mg Oral DAILY   ??? sodium chloride (NS) flush 5-40 mL  5-40 mL IntraVENous Q8H   ??? sodium chloride (NS) flush 5-40 mL  5-40 mL IntraVENous PRN   ??? ondansetron (ZOFRAN) injection 4 mg  4 mg IntraVENous Q4H PRN   ??? Warfarin:  dosing per MD   Other Rx Dosing/Monitoring        Prior to Admission Medications:  Prior to Admission medications    Medication Sig Start Date End Date Taking? Authorizing Provider   predniSONE (DELTASONE) 5 mg tablet Take 5 mg by mouth.   Yes Other, Phys, MD   AMLODIPINE BESYLATE, BULK, by Does Not Apply route.   Yes Other, Phys, MD   amLODIPine (NORVASC) 2.5 mg tablet Take 2.5 mg by mouth daily.   Yes Other, Phys, MD   colchicine (COLCRYS) 0.6 mg tablet Take 0.6 mg by mouth daily.   Yes  Other, Phys, MD   azithromycin (ZITHROMAX) 250 mg tablet Take 250 mg by mouth daily.   Yes Other, Phys, MD   cefUROXime (CEFTIN) 500 mg tablet Take 500 mg by mouth two (2) times a day.   Yes Other, Phys, MD   diclofenac (VOLTAREN) 1 % gel Apply 1 g to affected area.   Yes Other, Phys, MD   HYDROcodone-acetaminophen (NORCO) 5-325 mg per tablet Take 1 Tab by mouth.   Yes Other, Phys, MD   DULoxetine (CYMBALTA) 60 mg capsule Take 60 mg by mouth.   Yes Provider, Historical   warfarin (COUMADIN) 1 mg tablet Take 1 mg by mouth daily.   Yes Provider, Historical   levothyroxine (SYNTHROID) 88 mcg tablet Take 88 mcg by mouth.   Yes Provider, Historical        Review of Symptoms:  Except as noted in HPI, patient denies recent fever or chills, nausea, vomiting, diarrhea, hemoptysis, hematemesis, dysuria, myalgias, focal neurologic symptoms, ecchymosis, angioedema, odynophagia, dysphagia, sore throat, earache,rash, melena, hematochezia, depression, GERD, cold intolerance, petechia, bleeding gums, or significant weight loss.    A comprehensive review of systems was negative except for that written in the HPI.     Subjective:    24 hr VS reviewed, overall VSSAF  Temp (24hrs), Avg:97.7 ??F (36.5 ??C), Min:97.3 ??F (36.3 ??C), Max:98.1 ??F (36.7 ??C)    Patient Vitals for the past 8 hrs:   Pulse   08/05/17 0759 69   08/05/17 0304 76    Patient Vitals for the past 8 hrs:   Resp   08/05/17 0759 19   08/05/17 0304 20    Patient Vitals for the past 8 hrs:   BP   08/05/17 0759 130/72   08/05/17 0304 127/47          Intake/Output Summary (Last 24 hours) at 08/05/2017 0907  Last data filed at 08/05/2017 0305  Gross per 24 hour   Intake 240 ml   Output 325 ml   Net -85  ml         Physical Exam (complete single organ system exam)      Visit Vitals  BP 130/72 (BP 1 Location: Left arm, BP Patient Position: At rest)   Pulse 69   Temp 97.3 ??F (36.3 ??C)   Resp 19   Ht 5' 6"  (1.676 m)   Wt 154 lb (69.9 kg)   SpO2 95%   BMI 24.86 kg/m??      General Appearance:  Well developed, well nourished,alert and oriented x 3, and individual in no acute distress.   Ears/Nose/Mouth/Throat:   Hearing grossly normal.         Neck: Supple.   Chest:   Lungs clear to auscultation bilaterally.   Cardiovascular:  Regular rate and rhythm, S1, S2 normal, no murmur.   Abdomen:   Soft, non-tender, bowel sounds are active.   Extremities: No edema bilaterally.    Skin: Warm and dry.               Cardiographics    Telemetry: normal sinus rhythm, AV paced  ECG: sinus with V paced breats  Echocardiogram: Not done    Labs:   Recent Results (from the past 24 hour(s))   EKG, 12 LEAD, INITIAL    Collection Time: 08/04/17  1:51 PM   Result Value Ref Range    Ventricular Rate 101 BPM    Atrial Rate 105 BPM    QRS Duration 150 ms    Q-T Interval 420 ms    QTC Calculation (Bezet) 544 ms    Calculated R Axis -102 degrees    Calculated T Axis 94 degrees    Diagnosis       Ventricular-paced rhythm  Abnormal ECG    Confirmed by Cammy Copa MD. ([id number]) on 08/04/2017 7:19:23 PM     SAMPLES BEING HELD    Collection Time: 08/04/17  1:58 PM   Result Value Ref Range    SAMPLES BEING HELD 1RED     COMMENT        Add-on orders for these samples will be processed based on acceptable specimen integrity and analyte stability, which may vary by analyte.   CBC W/O DIFF    Collection Time: 08/04/17  1:58 PM   Result Value Ref Range    WBC 5.8 3.6 - 11.0 K/uL    RBC 4.79 3.80 - 5.20 M/uL    HGB 14.4 11.5 - 16.0 g/dL    HCT 44.8 35.0 - 47.0 %    MCV 93.5 80.0 - 99.0 FL    MCH 30.1 26.0 - 34.0 PG    MCHC 32.1 30.0 - 36.5 g/dL    RDW 18.5 (H) 11.5 - 14.5 %    PLATELET 254 150 - 400 K/uL    MPV 10.4 8.9 - 12.9 FL    NRBC 0.0 0 PER 100 WBC    ABSOLUTE NRBC 0.00 0.00 - 3.23 K/uL   METABOLIC PANEL, COMPREHENSIVE    Collection Time: 08/04/17  1:58 PM   Result Value Ref Range    Sodium 140 136 - 145 mmol/L    Potassium 4.0 3.5 - 5.1 mmol/L    Chloride 101 97 - 108 mmol/L    CO2 29 21 - 32 mmol/L     Anion gap 10 5 - 15 mmol/L    Glucose 94 65 - 100 mg/dL    BUN 20 6 - 20 MG/DL    Creatinine 1.57 (H) 0.55 - 1.02 MG/DL  BUN/Creatinine ratio 13 12 - 20      GFR est AA 38 (L) >60 ml/min/1.48m    GFR est non-AA 31 (L) >60 ml/min/1.774m   Calcium 8.7 8.5 - 10.1 MG/DL    Bilirubin, total 0.6 0.2 - 1.0 MG/DL    ALT (SGPT) 24 12 - 78 U/L    AST (SGOT) 25 15 - 37 U/L    Alk. phosphatase 140 (H) 45 - 117 U/L    Protein, total 6.4 6.4 - 8.2 g/dL    Albumin 3.1 (L) 3.5 - 5.0 g/dL    Globulin 3.3 2.0 - 4.0 g/dL    A-G Ratio 0.9 (L) 1.1 - 2.2     TROPONIN I    Collection Time: 08/04/17  1:58 PM   Result Value Ref Range    Troponin-I, Qt. 0.10 (H) <0.05 ng/mL   PROTHROMBIN TIME + INR    Collection Time: 08/04/17  1:58 PM   Result Value Ref Range    INR 2.7 (H) 0.9 - 1.1      Prothrombin time 26.5 (H) 9.0 - 11.1 sec   PTT    Collection Time: 08/04/17  1:58 PM   Result Value Ref Range    aPTT 27.7 22.1 - 32.0 sec    aPTT, therapeutic range     58.0 - 77.0 SECS   URINALYSIS W/MICROSCOPIC    Collection Time: 08/04/17  3:18 PM   Result Value Ref Range    Color YELLOW/STRAW      Appearance CLEAR CLEAR      Specific gravity 1.010 1.003 - 1.030      pH (UA) 5.5 5.0 - 8.0      Protein NEGATIVE  NEG mg/dL    Glucose NEGATIVE  NEG mg/dL    Ketone NEGATIVE  NEG mg/dL    Bilirubin NEGATIVE  NEG      Blood NEGATIVE  NEG      Urobilinogen 0.2 0.2 - 1.0 EU/dL    Nitrites NEGATIVE  NEG      Leukocyte Esterase NEGATIVE  NEG      WBC 0-4 0 - 4 /hpf    RBC 0-5 0 - 5 /hpf    Epithelial cells FEW FEW /lpf    Bacteria NEGATIVE  NEG /hpf   URINE CULTURE HOLD SAMPLE    Collection Time: 08/04/17  3:18 PM   Result Value Ref Range    Urine culture hold        URINE ON HOLD IN MICROBIOLOGY DEPT FOR 3 DAYS. IF UNPRESERVED URINE IS SUBMITTED, IT CANNOT BE USED FOR ADDITIONAL TESTING AFTER 24 HRS, RECOLLECTION WILL BE REQUIRED.   TROPONIN I    Collection Time: 08/04/17  8:22 PM   Result Value Ref Range    Troponin-I, Qt. 0.10 (H) <0<8.84g/mL    METABOLIC PANEL, COMPREHENSIVE    Collection Time: 08/05/17  1:45 AM   Result Value Ref Range    Sodium 139 136 - 145 mmol/L    Potassium 3.9 3.5 - 5.1 mmol/L    Chloride 103 97 - 108 mmol/L    CO2 25 21 - 32 mmol/L    Anion gap 11 5 - 15 mmol/L    Glucose 102 (H) 65 - 100 mg/dL    BUN 22 (H) 6 - 20 MG/DL    Creatinine 1.45 (H) 0.55 - 1.02 MG/DL    BUN/Creatinine ratio 15 12 - 20      GFR est AA 42 (L) >60 ml/min/1.7358m  GFR est non-AA 34 (L) >60 ml/min/1.60m    Calcium 8.2 (L) 8.5 - 10.1 MG/DL    Bilirubin, total 0.6 0.2 - 1.0 MG/DL    ALT (SGPT) 17 12 - 78 U/L    AST (SGOT) 25 15 - 37 U/L    Alk. phosphatase 106 45 - 117 U/L    Protein, total 5.5 (L) 6.4 - 8.2 g/dL    Albumin 2.7 (L) 3.5 - 5.0 g/dL    Globulin 2.8 2.0 - 4.0 g/dL    A-G Ratio 1.0 (L) 1.1 - 2.2     LIPID PANEL    Collection Time: 08/05/17  1:45 AM   Result Value Ref Range    LIPID PROFILE          Cholesterol, total 191 <200 MG/DL    Triglyceride 127 <150 MG/DL    HDL Cholesterol 64 MG/DL    LDL, calculated 101.6 (H) 0 - 100 MG/DL    VLDL, calculated 25.4 MG/DL    CHOL/HDL Ratio 3.0 0 - 5.0     CBC WITH AUTOMATED DIFF    Collection Time: 08/05/17  1:45 AM   Result Value Ref Range    WBC 5.1 3.6 - 11.0 K/uL    RBC 4.42 3.80 - 5.20 M/uL    HGB 13.4 11.5 - 16.0 g/dL    HCT 42.7 35.0 - 47.0 %    MCV 96.6 80.0 - 99.0 FL    MCH 30.3 26.0 - 34.0 PG    MCHC 31.4 30.0 - 36.5 g/dL    RDW 18.6 (H) 11.5 - 14.5 %    PLATELET 235 150 - 400 K/uL    MPV 10.5 8.9 - 12.9 FL    NRBC 0.0 0 PER 100 WBC    ABSOLUTE NRBC 0.00 0.00 - 0.01 K/uL    NEUTROPHILS 73 32 - 75 %    LYMPHOCYTES 14 12 - 49 %    MONOCYTES 11 5 - 13 %    EOSINOPHILS 2 0 - 7 %    BASOPHILS 0 0 - 1 %    IMMATURE GRANULOCYTES 0 0.0 - 0.5 %    ABS. NEUTROPHILS 3.7 1.8 - 8.0 K/UL    ABS. LYMPHOCYTES 0.7 (L) 0.8 - 3.5 K/UL    ABS. MONOCYTES 0.6 0.0 - 1.0 K/UL    ABS. EOSINOPHILS 0.1 0.0 - 0.4 K/UL    ABS. BASOPHILS 0.0 0.0 - 0.1 K/UL    ABS. IMM. GRANS. 0.0 0.00 - 0.04 K/UL    DF SMEAR SCANNED       PLATELET COMMENTS Large Platelets      RBC COMMENTS ANISOCYTOSIS  1+        RBC COMMENTS OVALOCYTES  PRESENT       PROTHROMBIN TIME + INR    Collection Time: 08/05/17  1:45 AM   Result Value Ref Range    INR 3.1 (H) 0.9 - 1.1      Prothrombin time 29.9 (H) 9.0 - 11.1 sec   TROPONIN I    Collection Time: 08/05/17  1:45 AM   Result Value Ref Range    Troponin-I, Qt. 0.10 (H) <0.05 ng/mL        Assessment:     Assessment:   Falls; frequent; need to check for arrhythmia or ischemia; orthostatic hypotension is also a possibility  Elevated troponins; due to most likely demand ischemia but need to rule out objective ischemia and CAD  SSS; s/p pacer  H/o PAF  Chronic anticoagulation  Plan:   Tele  Interrogate pacer for arrhythmia  Orthostatic BP checks  Echo  Needs a chemical stress test on Monday    Vicenta Aly, MD

## 2017-08-05 NOTE — Progress Notes (Signed)
Care Management -     Met with patient in the room. Patient said that she has been back and forth between [place] (patient called it "[facility]"), [facility]), and home numerous times the last several months.  While a [facility], saw multiple physicians outpatient. She could not remember dates. She gave this CM permission to call her son-"Chuck". She said he is her mPOA. She also has a daughter-Linda who lives in [place] and another daughter Tye Zortman [phone]) who [place]. Patient said she would like to go back to the rehab she was at before. She liked it there.     Delivered Code 44. Explained observation status. Patient signed MOON letter and State Observation Notice.    Called patient's son Dorena Bodo" [place] [phone]).     Patient lives alone. Son said patient has a diagnosis of dementia. He gave the below dates as estimates of when she was in the various facilities. He confirmed he is mPOA, but his sister Tye [place] [phone]) has been keeping specific record of the dates patient has been in the facilities.     ? To 05-03-17 [facility]  05-03-17 to 06-24-17 [facility]  06-24-17 home (son stayed with her 2 nights)  06-27-17 to 06-30-17 [facility]  06-30-17 to 07-29-17 [facility]  07-29-17 home with [facility] [place] (Son stayed with her 4 nights. She has been by herself    He said the family was not able to keep up with staying with the patient and feels that something different needs to happen. Her children would be agreeable to her going to [facility] as long as patient is agreeable.     Patient has long term health insurance. It picks up after 100 days. They do not have to be consecutive days, but have to be within a 160 day time frame. It will cover up to 300 per day up to 4 years, for a total of approximately $442,000 total to be paid out. Then she also has private pay funds after that.     Emergency Contact:   MPOA/son - Dorena Bodo" Donn [phone])    Reason for Admission:   fall                  RRAT Score:     14             Do you (patient/family) have any concerns for transition/discharge?     Patient wants to go to SNF.              Plan for utilizing home health:  Open to [facility] prior to admission.       Likelihood of readmission?   Moderate. Patient has had frequent falls and has had at least one other admission at [facility] in past 6 months.             Transition of Care Plan:      Discharge to [facility] either SNF or longterm care insurance if [facility] has a bed and can accept her. This CM will call [facility] tomorrow after 10 AM and speak with admissions, then will call patient's daughter Tye Juana Di­az per son's request.     Care Management Interventions  PCP Verified by CM: Yes(Dr. Harvie Bridge)  Palliative Care Criteria Met (RRAT>21 & CHF Dx)?: No  Mode of Transport at Discharge: Other (see comment)(TBD- family verses BLS)  Discharge Durable Medical Equipment: No  Physical Therapy Consult: Yes  Occupational Therapy  Consult: Yes  Speech Therapy Consult: No  Current Support Network: Lives Alone  Confirm Follow Up Transport: Friends  Plan discussed with Pt/Family/Caregiver: Yes  [place] Provided?: No  Discharge Location  Discharge Placement: Skilled nursing facility    Collier Flowers, MSW

## 2017-08-05 NOTE — Progress Notes (Signed)
Hospitalist Progress Note  Paulino Rilyassandra A Alexandar Weisenberger, NP  Answering service: [phone] from in house phone  Cell: [phone]      Date of Service:  08/05/2017  NAME:  Jenny ColonelMargaret W Hagos  DOB:  1929/03/06  MRN:  [id number]      Admission Summary:   [name] is a 84 yo female with PMH significant for acute gout for which she just was released from rehab for ~ 2 weeks ago, arrhythmia, valvular disease, pulmonary HTN, and GERD. Came to SP[facility] s/p fall with no LOC and found to have elevated troponin and admitted for further work up.    Interval history / Subjective:     "I feel good and hope it's not my heart." Awaiting pacer interrogation. Cardiology evaluated earlier and recommended cardiac workup given history of SSS and such with stress test Monday. She is aware of this and agreeable to plan of care.      Assessment & Plan:     Fall  - She actually tells me she was not dizzy prior to the fall, so ? History of this  - Has reason for falls given severe gout to feet and she only endorsed 1 fall to me since she was released from rehab 2 weeks ago  - Denied LOC and no pain with negative head CT  - Cardiac work up given history to include pacer interrogation, orthostatic BP, and stress test on Monday to r/o ischemia as etiology    H/o cardiac arrhythmia  - Pacer placed ~ 9 years ago and battery changed ~ 1 year ago  - Interrogate today    Elevated troponin (POA)  - Could be multi-factorial in setting of demand ischemia; however, cardiac workup underway  - Has remained flat    Severe gout  - PT/OT and have asked RN to get OOB  - Continue Colchicine & low dose Prednisone    CKD, stage 3  - Cr at baseline    H/o HTN  - Continue Norvasc and monitor for further adjustments    H/o Hypothyroid  - Continue Synthroid -> check TSH tomorrow    Code status: Full  DVT prophylaxis: SCDs    Care Plan discussed with: Patient, RN, Attending, Cardiology (Dr. Loa Sockso)   Disposition: Pending PT evaluation. Just released from rehab 2 weeks ago    * Downgrade to remote tele     Hospital Problems  Date Reviewed: 08/04/2017          Codes Class Noted POA    Pulmonary hypertension (HCC) ICD-10-CM: I27.20  ICD-9-CM: 416.8  11/21/2011 Yes        * (Principal) Falls ICD-10-CM: [id number]  ICD-9-CM: E888.9  08/04/2017 Yes        Elevated troponin ICD-10-CM: R74.8  ICD-9-CM: 790.6  08/04/2017 Unknown        Gout ICD-10-CM: M10.9  ICD-9-CM: 274.9  08/04/2017 Unknown        CKD (chronic kidney disease) ICD-10-CM: N18.9  ICD-9-CM: 585.9  08/04/2017 Unknown        Heart valve disease ICD-10-CM: I38  ICD-9-CM: 424.90  08/04/2017 Unknown        Frequent falls ICD-10-CM: R29.6  ICD-9-CM: V15.88  08/04/2017 Unknown        Gait instability ICD-10-CM: R26.81  ICD-9-CM: 781.2  08/04/2017 Unknown        Anticoagulated on Coumadin ICD-10-CM: Z51.81, Z79.01  ICD-9-CM: V58.83, V58.61  08/04/2017 Unknown  Review of Systems:   Denies CP or palpitations, no abdominal pain, no difficulty with urination, no dizziness       Vital Signs:    Last 24hrs VS reviewed since prior progress note. Most recent are:  Visit Vitals  BP 137/73   Pulse 90   Temp 97.9 ??F (36.6 ??C)   Resp 18   Ht 5\' 6"  (1.676 m)   Wt 68.5 kg (151 lb)   SpO2 96%   BMI 24.37 kg/m??         Intake/Output Summary (Last 24 hours) at 08/05/2017 1529  Last data filed at 08/05/2017 0305  Gross per 24 hour   Intake 240 ml   Output 325 ml   Net -85 ml        Physical Examination:             Constitutional:  No acute distress, cooperative, pleasant??   ENT:  Oral mucous moist, oropharynx benign. Neck supple,    Resp:  CTA bilaterally. No wheezing/rhonchi/rales. No accessory muscle use   CV:  Regular rhythm, normal rate, no murmurs, gallops, rubs    GI:  Soft, non distended, non tender. normoactive bowel sounds, no hepatosplenomegaly     Musculoskeletal:  No edema, warm, 2+ pulses throughout. Malformation of feet     Neurologic:  Moves all extremities.  AAOx3, CN II-XII reviewed. No focal deficits     Psych:  Good insight, Not anxious nor agitated.  Skin:  Good turgor, no rashes or ulcers       Data Review:    Review and/or order of clinical lab test  Review and/or order of tests in the radiology section of CPT  Review and/or order of tests in the medicine section of CPT      Labs:     Recent Labs     08/05/17  0145 08/04/17  1358   WBC 5.1 5.8   HGB 13.4 14.4   HCT 42.7 44.8   PLT 235 254     Recent Labs     08/05/17  0145 08/04/17  1358   NA 139 140   K 3.9 4.0   CL 103 101   CO2 25 29   BUN 22* 20   CREA 1.45* 1.57*   GLU 102* 94   CA 8.2* 8.7     Recent Labs     08/05/17  0145 08/04/17  1358   SGOT 25 25   ALT 17 24   AP 106 140*   TBILI 0.6 0.6   TP 5.5* 6.4   ALB 2.7* 3.1*   GLOB 2.8 3.3     Recent Labs     08/05/17  0145 08/04/17  1358   INR 3.1* 2.7*   PTP 29.9* 26.5*   APTT  --  27.7      No results for input(s): FE, TIBC, PSAT, FERR in the last 72 hours.   No results found for: FOL, RBCF   No results for input(s): PH, PCO2, PO2 in the last 72 hours.  Recent Labs     08/05/17  0145 08/04/17  2022 08/04/17  1358   TROIQ 0.10* 0.10* 0.10*     Lab Results   Component Value Date/Time    Cholesterol, total 191 08/05/2017 01:45 AM    HDL Cholesterol 64 08/05/2017 01:45 AM    LDL, calculated 101.6 (H) 96/10/5407 01:45 AM    Triglyceride 127 08/05/2017 01:45 AM    CHOL/HDL Ratio 3.0 08/05/2017 01:45  AM     No results found for: [facility]  Lab Results   Component Value Date/Time    Color YELLOW/STRAW 08/04/2017 03:18 PM    Appearance CLEAR 08/04/2017 03:18 PM    Specific gravity 1.010 08/04/2017 03:18 PM    pH (UA) 5.5 08/04/2017 03:18 PM    Protein NEGATIVE  08/04/2017 03:18 PM    Glucose NEGATIVE  08/04/2017 03:18 PM    Ketone NEGATIVE  08/04/2017 03:18 PM    Bilirubin NEGATIVE  08/04/2017 03:18 PM    Urobilinogen 0.2 08/04/2017 03:18 PM    Nitrites NEGATIVE  08/04/2017 03:18 PM     Leukocyte Esterase NEGATIVE  08/04/2017 03:18 PM    Epithelial cells FEW 08/04/2017 03:18 PM    Bacteria NEGATIVE  08/04/2017 03:18 PM    WBC 0-4 08/04/2017 03:18 PM    RBC 0-5 08/04/2017 03:18 PM         Medications Reviewed:     Current Facility-Administered Medications   Medication Dose Route Frequency   ??? docusate sodium (COLACE) capsule 100 mg  100 mg Oral DAILY   ??? sodium chloride (NS) flush 5-40 mL  5-40 mL IntraVENous Q8H   ??? sodium chloride (NS) flush 5-40 mL  5-40 mL IntraVENous PRN   ??? amLODIPine (NORVASC) tablet 2.5 mg  2.5 mg Oral DAILY   ??? colchicine tablet 0.6 mg  0.6 mg Oral DAILY   ??? DULoxetine (CYMBALTA) capsule 60 mg  60 mg Oral DAILY   ??? HYDROcodone-acetaminophen (NORCO) 5-325 mg per tablet 1 Tab  1 Tab Oral Q4H PRN   ??? levothyroxine (SYNTHROID) tablet 88 mcg  88 mcg Oral ACB   ??? predniSONE (DELTASONE) tablet 5 mg  5 mg Oral DAILY WITH BREAKFAST   ??? sodium chloride (NS) flush 5-40 mL  5-40 mL IntraVENous Q8H   ??? sodium chloride (NS) flush 5-40 mL  5-40 mL IntraVENous PRN   ??? ondansetron (ZOFRAN) injection 4 mg  4 mg IntraVENous Q4H PRN     ______________________________________________________________________  EXPECTED LENGTH OF STAY: - - -  ACTUAL LENGTH OF STAY:          1                 Liller Yohn A Judson Tsan, NP

## 2017-08-05 NOTE — Progress Notes (Signed)
Attempted to call report to [facility] east

## 2017-08-06 LAB — PROTHROMBIN TIME + INR
INR: 2.3 — ABNORMAL HIGH (ref 0.9–1.1)
Prothrombin time: 21.9 s — ABNORMAL HIGH (ref 9.0–11.1)

## 2017-08-06 LAB — TSH 3RD GENERATION: TSH: 2.71 u[IU]/mL (ref 0.36–3.74)

## 2017-08-06 MED FILL — DOK 100 MG CAPSULE: 100 mg | ORAL | Qty: 1

## 2017-08-06 MED FILL — NORMAL SALINE FLUSH 0.9 % INJECTION SYRINGE: INTRAMUSCULAR | Qty: 10

## 2017-08-06 MED FILL — DULOXETINE 60 MG CAP, DELAYED RELEASE: 60 mg | ORAL | Qty: 1

## 2017-08-06 MED FILL — HYDROCODONE-ACETAMINOPHEN 5 MG-325 MG TAB: 5-325 mg | ORAL | Qty: 1

## 2017-08-06 MED FILL — PREDNISONE 5 MG TAB: 5 mg | ORAL | Qty: 1

## 2017-08-06 MED FILL — COLCHICINE 0.6 MG TAB: 0.6 mg | ORAL | Qty: 1

## 2017-08-06 MED FILL — AMLODIPINE 5 MG TAB: 5 mg | ORAL | Qty: 1

## 2017-08-06 MED FILL — LEVOTHYROXINE 88 MCG TAB: 88 mcg | ORAL | Qty: 1

## 2017-08-06 NOTE — Progress Notes (Signed)
Problem: Self Care Deficits Care Plan (Adult)  Goal: *Acute Goals and Plan of Care (Insert Text)  Occupational Therapy Goals  Initiated 08/06/2017   1.  Patient will completed toilet transfer with CG within 7 days.  2.  Patient will complete toileting with min A within 7 days.  3.  Patient will complete dressing from chair level with min A within 7 days.  4.  Patient will complete bathing from chair with min A within 7 days.  5.  Patient will complete grooming activities standing for 3 minutes within 7 days.   6.  Patient will complete light IADL activity with min A within 7 days.   Occupational Therapy EVALUATION  Patient: Jenny [name] (84 y.o. female)  Date: 08/06/2017  Primary Diagnosis: Falls  Frequent falls  Elevated troponin       Precautions:   Fall    ASSESSMENT :  Based on the objective data described below, the patient presents with decreased independence with self care and functional mobility following admission for frequent falls and anemia.  Pt with recent history of 2 hospital admission to [facility] with 2 discharges all to SNF level rehab.  Pt staring in SNF for almost one month with each admission and most recently discharged 07/29/17.  Son attempted to provide support for patient but unable to meet her needs in her home.  Pt does have significant history with dementia but is able to fully answer orientation questions without difficulty but does have difficulty maintaining conversation and remaining on topic during conversation.      At this time, pt is not safe to return home.  Orthostatics assessed and noted drop with progression OOB but pt was asymptomatic. She currently requires mod A for functional transfers and total A for basic ADL activities.  Recommend discharge to rehab facility to maximize independence and safety with all tasks.  Pt may require long term housing changes and could greatly benefit from ALF.      Vitals:    08/06/17 0747 08/06/17 1009 08/06/17 1014 08/06/17 1017    BP: 145/75 151/70 122/72 125/70   BP 1 Location: Left arm Left arm Left arm Left arm   BP Patient Position: At rest Supine Sitting Sitting;Post activity  Comment: unable to achieve BP standing   Pulse: 70 70 73 70   Resp: 16      Temp: 98.4 ??F (36.9 ??C)      SpO2: 92%      Weight:       Height:             Patient will benefit from skilled intervention to address the above impairments.  Patient???s rehabilitation potential is considered to be Fair  Factors which may influence rehabilitation potential include:   []              None noted  [x]              Mental ability/status  []              Medical condition  []              Home/family situation and support systems  [x]              Safety awareness  []              Pain tolerance/management  []              Other:      PLAN :  Recommendations and Planned Interventions:  [  x]               Self Care Training                  [x]         Therapeutic Activities  [x]                Functional Mobility Training    []         Cognitive Retraining  [x]                Therapeutic Exercises           [x]         Endurance Activities  [x]                Balance Training                   []         Neuromuscular Re-Education  []                Visual/Perceptual Training     [x]    Home Safety Training  [x]                Patient Education                 [x]         Family Training/Education  []                Other (comment):    Frequency/Duration: Patient will be followed by occupational therapy 5 times a week to address goals.  Discharge Recommendations: Rehab  Further Equipment Recommendations for Discharge: TBD     SUBJECTIVE:   Patient stated ???I am so tired of hearing this music.???  Her roommate (B Bed) stating the "music" is the humming from the heating system in her room.     OBJECTIVE DATA SUMMARY:   HISTORY:   Past Medical History:   Diagnosis Date   ??? Arrhythmia     atrial Fib   ??? Arrhythmia     SSS   ??? Arthritis    ??? GERD (gastroesophageal reflux disease)    ??? Hypertension     ??? Thyroid disease     hypo     Past Surgical History:   Procedure Laterality Date   ??? ABDOMEN SURGERY PROC UNLISTED  > 30 years ago    partial colectomy; pt not sure why   ??? HX ADENOIDECTOMY     ??? HX ANKLE FRACTURE TX Left 1992    left   ??? HX COLONOSCOPY     ??? HX MOHS PROCEDURES Right 1996    right   ??? HX ORTHOPAEDIC  1994 and 1996    back surgery with rods   ??? HX ORTHOPAEDIC Bilateral     Bil knee replacements ; diff years   ??? HX ORTHOPAEDIC Right 1997    carpal tunnel   ??? HX PACEMAKER     ??? HX TAH AND BSO     ??? HX TONSILLECTOMY         Prior Level of Function/Environment/Context: pt reports she was independent at home.  She utilizes a rollator but unclear if she actually does use this at home when ambulating.     Expanded or extensive additional review of patient history:     Home Situation  Home Environment: Private residence  # Steps to Enter: 3  Rails to Enter: Yes  One/Two Story Residence: One story  Living Alone: Yes  Support Systems: Family member(s)  Patient Expects to be Discharged to:: Private residence  Current DME Used/Available at Home: Dan HumphreysWalker, rolling, Environmental consultantWalker, rollator, Raised toilet seat, Psychiatristhower chair  Tub or Shower Type: Advice workerhower    Hand dominance: RightEXAMINATION OF PERFORMANCE DEFICITS:  Cognitive/Behavioral Status:  Neurologic State: Alert  Orientation Level: Oriented X4(history of dementia )  Cognition: Follows commands;Decreased attention/concentration  Perception: Appears intact  Perseveration: No perseveration noted  Safety/Judgement: Good awareness of safety precautions  Skin: see nursing notes    Edema: none noted    Hearing:  Auditory  Auditory Impairment: Hard of hearing, bilateral  Vision/Perceptual:                           Acuity: Within Defined Limits       Range of Motion:    AROM: Generally decreased, functional  PROM: Generally decreased, functional                    Strength:    Strength: Generally decreased, functional              Coordination:   Coordination: Generally decreased, functional  Fine Motor Skills-Upper: Right Intact;Left Intact    Gross Motor Skills-Upper: Right Intact;Left Intact  Tone & Sensation:    Tone: Normal  Sensation: Intact                    Balance:  Sitting: Intact  Standing: Impaired;With support  Standing - Static: Fair  Standing - Dynamic : Poor  Functional Mobility and Transfers for ADLs:Bed Mobility:  Supine to Sit: Supervision  Sit to Supine: Minimum assistance    Transfers:  Sit to Stand: Moderate assistance;Assist x1  Stand to Sit: Moderate assistance  Bed to Chair: Moderate assistance  Bathroom Mobility: (unable to transfer to bathroom )  [facility] Transfer : [facility]    ADL Assessment:  Feeding: Supervision    Oral Facial Hygiene/Grooming: Supervision    Bathing: Total assistance    Upper Body Dressing: Total assistance    Lower Body Dressing: Total assistance    Toileting: Total assistance              ADL Intervention and task modifications:   progressed to sitting EOB in attempts to assess orthostatics.  She was agreeable and overall did well but does have decreased standing tolerance due to increased pain through right buttocks and lower back.     Cognitive Retraining  Safety/Judgement: Good awareness of safety precautions    Functional Measure:  Barthel Index:    Bathing: 0  Bladder: 5  Bowels: 5  Grooming: 5  Dressing: 0  Feeding: 10  Mobility: 0  Stairs: 0  Toilet Use: 0  Transfer (Bed to Chair and Back): 5  Total: 30       The Barthel ADL Index: Guidelines  1. The index should be used as a record of what a patient does, not as a record of what a patient could do.  2. The main aim is to establish degree of independence from any help, physical or verbal, however minor and for whatever reason.  3. The need for supervision renders the patient not independent.  4. A patient's performance should be established using the best available  evidence. Asking the patient, friends/relatives and nurses are the usual sources, but direct observation and common sense are also important. However direct testing is not needed.  5. Usually the patient's performance over the preceding 24-48 hours is important, but occasionally longer periods will be relevant.  6. Middle categories imply that the patient supplies over 50 per cent of the effort.  7. Use of aids to be independent is allowed.    Clarisa Kindred., Barthel, D.W. [phone]). Functional evaluation: the Barthel Index. Md State Med J (14)2.  Zenaida Niece der [place], J.J.M.F, [place], Ian Malkin., Margret Chance., [place], [place]. (1999). Measuring the change indisability after inpatient rehabilitation; comparison of the responsiveness of the Barthel Index and Functional Independence Measure. Journal of Neurology, Neurosurgery, and Psychiatry, 66(4), [phone].  Dawson Bills, N.J.A, [place],  W.J.M, & [place], M.A. (2004.) Assessment of post-stroke quality of life in cost-effectiveness studies: The usefulness of the Barthel Index and the EuroQoL-5D. Quality of Life Research, 62, 478-29     Occupational Therapy Evaluation Charge Determination   History Examination Decision-Making   HIGH Complexity : Extensive review of history including physical, cognitive and psychosocial history  HIGH Complexity : 5 or more performance deficits relating to physical, cognitive , or psychosocial skils that result in activity limitations and / or participation restrictions HIGH Complexity : Patient presents with comorbidities that affect occupational performance. Signifigant modification of tasks or assistance (eg, physical or verbal) with assessment (s) is necessary to enable patient to complete evaluation       Based on the above components, the patient evaluation is determined to be of the following complexity level: HIGH   Pain:  Pain Scale 1: Numeric (0 - 10)  Pain Intensity 1: 6  Pain Location 1: Back;Head  Pain Orientation 1: Lower   Pain Description 1: Aching  Pain Intervention(s) 1: Medication (see MAR)  Activity Tolerance:   Patient Vitals for the past 4 hrs:   Temp Pulse Resp BP SpO2   08/06/17 1017 ??? 70 ??? 125/70 ???   08/06/17 1014 ??? 73 ??? 122/72 ???   08/06/17 1009 ??? 70 ??? 151/70 ???   08/06/17 0747 98.4 ??F (36.9 ??C) 70 16 145/75 92 %       Please refer to the flowsheet for vital signs taken during this treatment.  After treatment:   []  Patient left in no apparent distress sitting up in chair  [x]  Patient left in no apparent distress in bed  [x]  Call bell left within reach  [x]  Nursing notified  []  Caregiver present  []  Bed alarm activated    COMMUNICATION/EDUCATION:   The patient???s plan of care was discussed with: Physical Therapist and Registered Nurse.  [x]  Home safety education was provided and the patient/caregiver indicated understanding.  [x]  Patient/family have participated as able in goal setting and plan of care.  []  Patient/family agree to work toward stated goals and plan of care.  []  Patient understands intent and goals of therapy, but is neutral about his/her participation.  []  Patient is unable to participate in goal setting and plan of care.  This patient???s plan of care is appropriate for delegation to OTA.    Thank you for this referral.  Santa Lighter, OT  Time Calculation: 23 mins

## 2017-08-06 NOTE — Progress Notes (Addendum)
Cardiology Progress Note                                        Admit Date: 08/04/2017    Assessment/Plan:     Falls; no tachyarrhythmia on pacer interrogation; stress test in am  Afib; chronic; rate is well controlled  Mild AR/MR/TR with moderate pulmonary HTN from echo yesterday  CKD; unchanged  Elevated troponins; not high enough for NSTEMI but could not rule out ischemia      Jenny [name] is a 84 y.o. female with     PROBLEM LIST:  Patient Active Problem List    Diagnosis Date Noted   ??? Pulmonary hypertension (HCC) 11/21/2011     Priority: 1 - One   ??? Falls 08/04/2017   ??? Elevated troponin 08/04/2017   ??? Gout 08/04/2017   ??? CKD (chronic kidney disease) 08/04/2017   ??? Heart valve disease 08/04/2017   ??? Frequent falls 08/04/2017   ??? Gait instability 08/04/2017   ??? Anticoagulated on Coumadin 08/04/2017         Subjective:     Jenny ColonelMargaret W Staubs reports none.    Visit Vitals  BP 145/75 (BP 1 Location: Left arm, BP Patient Position: At rest)   Pulse 70   Temp 98.4 ??F (36.9 ??C)   Resp 16   Ht 5\' 6"  (1.676 m)   Wt 151 lb (68.5 kg)   SpO2 92%   BMI 24.37 kg/m??       Intake/Output Summary (Last 24 hours) at 08/06/2017 0929  Last data filed at 08/05/2017 2234  Gross per 24 hour   Intake 350 ml   Output ???   Net 350 ml       Objective:      Physical Exam:  HEENT: P[place], EOMI  Neck: No JVD,  No thyroidmegaly  Resp: CTA bilaterally; No wheezes or rales  CV: irregular s1s2 No murmur no s3  ZOX:WRUEAbd:Soft, Nontender  Ext: No edema  Neuro: Alert and oriented; Nonfocal  Skin: Warm, Dry, Intact  Pulses: 2+ DP/PT/Rad      Telemetry: underlying Afib with paced beats    Current Facility-Administered Medications   Medication Dose Route Frequency   ??? docusate sodium (COLACE) capsule 100 mg  100 mg Oral DAILY   ??? sodium chloride (NS) flush 5-40 mL  5-40 mL IntraVENous Q8H   ??? sodium chloride (NS) flush 5-40 mL  5-40 mL IntraVENous PRN   ??? amLODIPine (NORVASC) tablet 2.5 mg  2.5 mg Oral DAILY   ??? colchicine tablet 0.6 mg  0.6 mg Oral DAILY    ??? DULoxetine (CYMBALTA) capsule 60 mg  60 mg Oral DAILY   ??? HYDROcodone-acetaminophen (NORCO) 5-325 mg per tablet 1 Tab  1 Tab Oral Q4H PRN   ??? levothyroxine (SYNTHROID) tablet 88 mcg  88 mcg Oral ACB   ??? predniSONE (DELTASONE) tablet 5 mg  5 mg Oral DAILY WITH BREAKFAST   ??? sodium chloride (NS) flush 5-40 mL  5-40 mL IntraVENous Q8H   ??? sodium chloride (NS) flush 5-40 mL  5-40 mL IntraVENous PRN   ??? ondansetron (ZOFRAN) injection 4 mg  4 mg IntraVENous Q4H PRN         Data Review:   Labs:    Recent Results (from the past 24 hour(s))   ECHO ADULT COMPLETE    Collection Time: 08/05/17 12:52 PM   Result  Value Ref Range    LA Volume 91.73 (A) 22 - 52 mL    LV E' Lateral Velocity 10.28 cm/s    LV E' Septal Velocity 6.57 cm/s    Tapse 1.76 1.5 - 2.0 cm    Aortic Valve Systolic Peak Velocity 201.80 cm/s    AoV VTI 38.63 cm    Aortic Valve Area by Continuity of Peak Velocity 0.8 cm2    AoV PG 16.3 mmHg    LVIDd 4.28 3.9 - 5.3 cm    LVPWd 1.04 (A) 0.6 - 0.9 cm    LVIDs 2.93 cm    IVSd 0.84 0.6 - 0.9 cm    LVOT d 1.89 cm    LVOT Peak Velocity 60.08 cm/s    LVOT Peak Gradient 1.4 mmHg    MVA (PHT) 3.7 cm2    MV A Vel 26.07 cm/s    MV E Vel 0.99 cm/s    MV E/A 0.04     RVIDd 3.36 cm    Right Ventricular Outflow Track Peak Velocity 59.35 cm/s    Aortic Valve Systolic Mean Gradient 10.3 mmHg    LA Vol 4C 59.85 (A) 22 - 52 mL    LA Vol 2C 114.18 (A) 22 - 52 mL    LA Area 4C 23.3 cm2    LV Mass AL 147.9 67 - 162 g    LV Mass AL Index 83.3 43 - 95 g/m2    E/E' lateral 0.10     E/E' septal 0.15     E/E' ratio (averaged) 0.12     Mitral Valve E Wave Deceleration Time 204.6 ms    Mitral Valve Pressure Half-time 59.3 ms    Left Atrium Major Axis 4.54 cm    Triscuspid Valve Regurgitation Peak Gradient 49.3 mmHg    Aortic Regurgitant Pressure Half-time 509.1 cm    TR Max Velocity 351.19 cm/s    LA Vol Index 51.68 16 - 28 ml/m2    PASP 55.0 mmHg    LA Vol Index 64.33 16 - 28 ml/m2    LA Vol Index 33.72 16 - 28 ml/m2     AR Max Vel 415.24 cm/s   PROTHROMBIN TIME + INR    Collection Time: 08/06/17  4:25 AM   Result Value Ref Range    INR 2.3 (H) 0.9 - 1.1      Prothrombin time 21.9 (H) 9.0 - 11.1 sec   TSH 3RD GENERATION    Collection Time: 08/06/17  4:25 AM   Result Value Ref Range    TSH 2.71 0.36 - 3.74 uIU/mL

## 2017-08-06 NOTE — Progress Notes (Addendum)
Hospitalist Progress Note  Jenny Amble, NP  Answering service: [phone] OR [phone] from in house phone  Cell: [phone]   Date of Service:  08/06/2017  NAME:  Jenny [name]  DOB:  02-22-1929  MRN:  [id number]    Admission Summary:   Jenny [name] is a 84 yo female with PMH significant for acute gout for which she just was released from rehab for ~ 2 weeks ago, arrhythmia, valvular disease, pulmonary HTN, and GERD. Came to [facility] s/p fall with no LOC and found to have elevated troponin and admitted for further work up.    Interval history / Subjective:      Pt in bed, a bit angry this afternoon about hearing music all night and not being able to have a good night sleep. At time of assessment, pt was not going to agree to a stress test because of multiple reasons: 1. Her cardiologist Jenny [name]) didn't order it 2. She has a headache 3. She is unable to sleep. 4. She can not walk.     Explained the stress test was ordered by a [facility] cardiologist - she is aware that declining further cardiac work up when ischemia could not be ruled out could result in an MI or another cardiac event - she is willing to take the risk. She was also made aware that the stress test did not involve walking or exercise of any kind. She still indicated she would decline. Will allow for patient to calm down. Called her son who indicated there was nothing he could do. Attempted to call her daughter - there was no answer.     Assessment & Plan:     Fall:  - She indicated to a previous provider she was not dizzy prior to the fall, so ? History of this  - Has reason for falls given severe gout to feet and she only endorsed 1 fall to previous provider since she was released from rehab ~2 weeks PTA  - Denied LOC and no pain with negative head CT  - Plan: Cardiac work up given history to include pacer interrogation, orthostatic BP, and stress test on Monday to r/o ischemia as etiology     Hx cardiac arrhythmia:  - Pacer placed ~ 9 years ago and battery changed ~ 1 year ago  - no tachyarrhythmia on pace maker interrogation per Jenny [name]    Elevated troponin (POA):  - Could be multi-factorial in setting of demand ischemia; however, cardiac workup underway if pt will be agreeable to stress test in am.  - trop have remained flat    Severe gout:  - pt declined PT/OT today  - Continue Colchicine & low dose Prednisone    CKD, stage 3: Cr at baseline    Hx HTN: Continue Norvasc and monitor for further adjustments    Hx Hypothyroid: Continue Synthroid -> TSH normal    Chronic Afib:   - rate controlled  - on coumadin PTA but coumadin currently held as INR  3.1 2/2    Code status: Full  DVT prophylaxis: SCDs  Care Plan discussed with: Patient, RN, Attending, Cardiology (Jenny [name])  Disposition: Pending PT evaluation (pt had declined). Just released from rehab 2 weeks ago.       Hospital Problems  Date Reviewed: 2017-08-14          Codes Class Noted POA    Pulmonary hypertension (HCC) ICD-10-CM: I27.20  ICD-9-CM: 416.8  11/21/2011 Yes        * (  Principal) Falls ICD-10-CM: W19.Lorne Skeens  ICD-9-CM: E888.9  08/04/2017 Yes        Elevated troponin ICD-10-CM: R74.8  ICD-9-CM: 790.6  08/04/2017 Unknown        Gout ICD-10-CM: M10.9  ICD-9-CM: 274.9  08/04/2017 Unknown        CKD (chronic kidney disease) ICD-10-CM: N18.9  ICD-9-CM: 585.9  08/04/2017 Unknown        Heart valve disease ICD-10-CM: I38  ICD-9-CM: 424.90  08/04/2017 Unknown        Frequent falls ICD-10-CM: R29.6  ICD-9-CM: V15.88  08/04/2017 Unknown        Gait instability ICD-10-CM: R26.81  ICD-9-CM: 781.2  08/04/2017 Unknown        Anticoagulated on Coumadin ICD-10-CM: Z51.81, Z79.01  ICD-9-CM: V58.83, V58.61  08/04/2017 Unknown            Review of Systems:   + HA. Denies CP or palpitations, no abdominal pain, no difficulty with urination, no dizziness + Right sided discomfort near hip.     Vital Signs:    Last 24hrs VS reviewed since prior progress note. Most recent are:   Visit Vitals  BP 161/77 (BP 1 Location: Left arm, BP Patient Position: At rest)   Pulse 70   Temp 97.7 ??F (36.5 ??C)   Resp 16   Ht 5\' 6"  (1.676 m)   Wt 68.5 kg (151 lb)   SpO2 93%   BMI 24.37 kg/m??       Intake/Output Summary (Last 24 hours) at 08/06/2017 1725  Last data filed at 08/05/2017 2234  Gross per 24 hour   Intake 350 ml   Output ???   Net 350 ml      Physical Examination:         Constitutional:  No acute distress. ??   ENT:  Oral mucous moist, oropharynx benign.     Resp:  No accessory muscle use   CV:  Regular rhythm, SR on tele.    GI:  Soft, non distended, non tender. Normoactive bowel sounds     Musculoskeletal:  No edema, warm, 2+ pulses throughout. Malformation of feet (nontender)    Neurologic:  Moves all extremities.  AAOx3, CN II-XII reviewed. No focal deficits   Psych: Upset/agitated     Data Review:   Review and/or order of clinical lab test  Review and/or order of tests in the radiology section of CPT  Review and/or order of tests in the medicine section of CPT    Labs:     Recent Labs     08/05/17  0145 08/04/17  1358   WBC 5.1 5.8   HGB 13.4 14.4   HCT 42.7 44.8   PLT 235 254     Recent Labs     08/05/17  0145 08/04/17  1358   NA 139 140   K 3.9 4.0   CL 103 101   CO2 25 29   BUN 22* 20   CREA 1.45* 1.57*   GLU 102* 94   CA 8.2* 8.7     Recent Labs     08/05/17  0145 08/04/17  1358   SGOT 25 25   ALT 17 24   AP 106 140*   TBILI 0.6 0.6   TP 5.5* 6.4   ALB 2.7* 3.1*   GLOB 2.8 3.3     Recent Labs     08/06/17  0425 08/05/17  0145 08/04/17  1358   INR 2.3* 3.1* 2.7*   PTP 21.9* 29.9*  26.5*   APTT  --   --  27.7      No results for input(s): FE, TIBC, PSAT, FERR in the last 72 hours.   No results found for: FOL, RBCF   No results for input(s): PH, PCO2, PO2 in the last 72 hours.  Recent Labs     08/05/17  0145 08/04/17  2022 08/04/17  1358   TROIQ 0.10* 0.10* 0.10*     Lab Results   Component Value Date/Time    Cholesterol, total 191 08/05/2017 01:45 AM    HDL Cholesterol 64 08/05/2017 01:45 AM     LDL, calculated 101.6 (H) 16/10/960402/08/2017 01:45 AM    Triglyceride 127 08/05/2017 01:45 AM    CHOL/HDL Ratio 3.0 08/05/2017 01:45 AM     No results found for: [facility]  Lab Results   Component Value Date/Time    Color YELLOW/STRAW 08/04/2017 03:18 PM    Appearance CLEAR 08/04/2017 03:18 PM    Specific gravity 1.010 08/04/2017 03:18 PM    pH (UA) 5.5 08/04/2017 03:18 PM    Protein NEGATIVE  08/04/2017 03:18 PM    Glucose NEGATIVE  08/04/2017 03:18 PM    Ketone NEGATIVE  08/04/2017 03:18 PM    Bilirubin NEGATIVE  08/04/2017 03:18 PM    Urobilinogen 0.2 08/04/2017 03:18 PM    Nitrites NEGATIVE  08/04/2017 03:18 PM    Leukocyte Esterase NEGATIVE  08/04/2017 03:18 PM    Epithelial cells FEW 08/04/2017 03:18 PM    Bacteria NEGATIVE  08/04/2017 03:18 PM    WBC 0-4 08/04/2017 03:18 PM    RBC 0-5 08/04/2017 03:18 PM     Medications Reviewed:     Current Facility-Administered Medications   Medication Dose Route Frequency   ??? docusate sodium (COLACE) capsule 100 mg  100 mg Oral DAILY   ??? sodium chloride (NS) flush 5-40 mL  5-40 mL IntraVENous Q8H   ??? sodium chloride (NS) flush 5-40 mL  5-40 mL IntraVENous PRN   ??? amLODIPine (NORVASC) tablet 2.5 mg  2.5 mg Oral DAILY   ??? colchicine tablet 0.6 mg  0.6 mg Oral DAILY   ??? DULoxetine (CYMBALTA) capsule 60 mg  60 mg Oral DAILY   ??? HYDROcodone-acetaminophen (NORCO) 5-325 mg per tablet 1 Tab  1 Tab Oral Q4H PRN   ??? levothyroxine (SYNTHROID) tablet 88 mcg  88 mcg Oral ACB   ??? predniSONE (DELTASONE) tablet 5 mg  5 mg Oral DAILY WITH BREAKFAST   ??? sodium chloride (NS) flush 5-40 mL  5-40 mL IntraVENous Q8H   ??? sodium chloride (NS) flush 5-40 mL  5-40 mL IntraVENous PRN   ??? ondansetron (ZOFRAN) injection 4 mg  4 mg IntraVENous Q4H PRN   ______________________________________________________________________  EXPECTED LENGTH OF STAY: - - -  ACTUAL LENGTH OF STAY:          1               Jenny AmbleHolly M Tarell Schollmeyer, NP

## 2017-08-06 NOTE — Progress Notes (Addendum)
Care Management - Spoke with Cyndi Lennertatherine Picket in admissions at [facility] at 10:45 AM. She said they do have skilled beds and private pay beds available. She asked that this CM send referral so they begin evaluation of the patient. Patient still to have a stress test tomorrow.     [facility] from [facility] to 06-25-17 (with a 11-14 to 11-18 bed hold while patient [facility]) and a stay 06-30-17 to 07-29-17. Per this CM calculations patient has used approximately 73 SNF days.    Sent referral via [facility] just now.     Lowella Dellina E McNeel, MSW    2:00 PM Updated patient's daughter.   Lowella Dellina E McNeel, MSW

## 2017-08-06 NOTE — Progress Notes (Signed)
Consult received, chart reviewed, pt cleared by nursing. Attempted eval (pt has received pain med) but pt adamantly refused stating that she was in too much pain, rating it at 10/10, discussed with her nurse. PT will follow and see for eval as appropriate. Thank you, Quentin Orearla Viray, PT

## 2017-08-07 ENCOUNTER — Observation Stay: Payer: MEDICARE | Primary: Internal Medicine

## 2017-08-07 LAB — METABOLIC PANEL, BASIC
Anion gap: 8 mmol/L (ref 5–15)
BUN/Creatinine ratio: 15 (ref 12–20)
BUN: 19 MG/DL (ref 6–20)
CO2: 27 mmol/L (ref 21–32)
Calcium: 8.2 MG/DL — ABNORMAL LOW (ref 8.5–10.1)
Chloride: 104 mmol/L (ref 97–108)
Creatinine: 1.23 MG/DL — ABNORMAL HIGH (ref 0.55–1.02)
GFR est AA: 50 mL/min/{1.73_m2} — ABNORMAL LOW (ref >60)
GFR est non-AA: 41 mL/min/{1.73_m2} — ABNORMAL LOW (ref >60)
Glucose: 96 mg/dL (ref 65–100)
Potassium: 4.2 mmol/L (ref 3.5–5.1)
Sodium: 139 mmol/L (ref 136–145)

## 2017-08-07 LAB — GLUCOSE, POC: Glucose (POC): 90 mg/dL (ref 65–100)

## 2017-08-07 LAB — PROTHROMBIN TIME + INR
INR: 1.5 — ABNORMAL HIGH (ref 0.9–1.1)
Prothrombin time: 15 s — ABNORMAL HIGH (ref 9.0–11.1)

## 2017-08-07 MED ORDER — REGADENOSON 0.4 MG/5 ML IV SYRINGE
0.4 mg/5 mL | Freq: Once | INTRAVENOUS | Status: AC
Start: 2017-08-07 — End: 2017-08-07
  Administered 2017-08-07: 13:00:00 via INTRAVENOUS

## 2017-08-07 MED ORDER — WARFARIN 1 MG TAB
1 mg | Freq: Once | ORAL | Status: AC
Start: 2017-08-07 — End: 2017-08-07
  Administered 2017-08-07: 22:00:00 via ORAL

## 2017-08-07 MED ORDER — PHARMACY WARFARIN NOTE
Status: DC
Start: 2017-08-07 — End: 2017-08-12

## 2017-08-07 MED ORDER — SODIUM CHLORIDE 0.9 % IJ SYRG
INTRAMUSCULAR | Status: AC
Start: 2017-08-07 — End: ?

## 2017-08-07 MED ORDER — SALINE PERIPHERAL FLUSH PRN
Freq: Once | INTRAMUSCULAR | Status: AC
Start: 2017-08-07 — End: 2017-08-07
  Administered 2017-08-07: 13:00:00

## 2017-08-07 MED ORDER — REGADENOSON 0.4 MG/5 ML IV SYRINGE
0.4 mg/5 mL | INTRAVENOUS | Status: AC
Start: 2017-08-07 — End: ?

## 2017-08-07 MED FILL — DOK 100 MG CAPSULE: 100 mg | ORAL | Qty: 1

## 2017-08-07 MED FILL — PREDNISONE 5 MG TAB: 5 mg | ORAL | Qty: 1

## 2017-08-07 MED FILL — NORMAL SALINE FLUSH 0.9 % INJECTION SYRINGE: INTRAMUSCULAR | Qty: 10

## 2017-08-07 MED FILL — DULOXETINE 60 MG CAP, DELAYED RELEASE: 60 mg | ORAL | Qty: 1

## 2017-08-07 MED FILL — AMLODIPINE 5 MG TAB: 5 mg | ORAL | Qty: 1

## 2017-08-07 MED FILL — WARFARIN 1 MG TAB: 1 mg | ORAL | Qty: 1

## 2017-08-07 MED FILL — LEXISCAN 0.4 MG/5 ML INTRAVENOUS SYRINGE: 0.4 mg/5 mL | INTRAVENOUS | Qty: 5

## 2017-08-07 MED FILL — HYDROCODONE-ACETAMINOPHEN 5 MG-325 MG TAB: 5-325 mg | ORAL | Qty: 1

## 2017-08-07 MED FILL — PHARMACY WARFARIN NOTE: Qty: 1

## 2017-08-07 MED FILL — LEVOTHYROXINE 88 MCG TAB: 88 mcg | ORAL | Qty: 1

## 2017-08-07 MED FILL — COLCHICINE 0.6 MG TAB: 0.6 mg | ORAL | Qty: 1

## 2017-08-07 MED FILL — NORMAL SALINE FLUSH 0.9 % INJECTION SYRINGE: INTRAMUSCULAR | Qty: 20

## 2017-08-07 NOTE — Other (Signed)
Letter of Status Determination:   Recommend hospitalization status upgraded from   OBSERVATION  to INPATIENT  Status     Pt Name:  Jenny [name]   MR#   HAR # [id number] /  [id number]  Payor: [place] MEDICARE / Plan: VA MEDICARE PART A & B / Product Type: Medicare /    CSN#  [id number]   [facility]  507[facility]   Hospitalization date  08/04/2017  1:38 PM   Current Attending Physician  Conrad BurlingtonJenkins, Amber M, DO   Principal diagnosis  Falls      Clinicals  Jenny [name] is a 84 y.o.  female with h/o gout,cardiac arrhythmia,valvular disease,pulm hypertension,hypothyroidism,gerd who is a transfer from [place] ED where she presented because of falls.Patient says that she was [facility] few weeks ago due to acute gout,then went to a rehab center and returned home six days ago.She is followed by home health nurse twice a week.She lives alone and uses a cane to move around.Today morning patient fell while trying to walk to get her clothes.She felt dizzy before the fall but did not pass out.Home health nurse was present, called EMS and patient was taken to [facility].In ED,CT head did not show any acute process.Labs showed troponin 0.10.Patient denies chest pain,headaches,short of breath at the present time.She is only saying that she wants to be able to walk well. Pt with elevated troponin, needing a stress test but refused the same THIS MORNING, Pt w/ severe gout, fall risk, poorly controlled HTN          Milliman (MCG) criteria   Does  NOT apply    STATUS DETERMINATION  INPATIENT   The final decision of the patient's hospitalization status depends on the attending physician's judgment    Additional comments     Payor: VA MEDICARE / Plan: VA MEDICARE PART A & B / Product Type: Medicare /         Jenny AllegraMUKTAK Micheline Markes MD  Cell: [phone]  Physician Advisor

## 2017-08-07 NOTE — Wound Image (Signed)
WOCN Note:     New consult for red bottom.    Chart shows:  Admitted for fall; history of gout  Admitted from home.    Assessment:   She is communicative, continent and mobile.  Patient reports no pain.    Mild dyschromia in gluteal cleft - NO erythema or bruising noted.  No redness or irritation.     No current wound care needs - will sign off.     Rodman PickleJulie Marthella Osorno, BSN, RN, [facility]  Certified Wound, Ostomy, Continence Nurse  office [phone]  pager [phone] or call operator to page

## 2017-08-07 NOTE — Progress Notes (Signed)
Problem: Mobility Impaired (Adult and Pediatric)  Goal: *Acute Goals and Plan of Care (Insert Text)  Physical Therapy Goals  Initiated 08/07/2017  1.  Patient will move from supine to sit and sit to supine  in bed with independence within 7 day(s).    2.  Patient will transfer from bed to chair and chair to bed with supervision using the least restrictive device within 7 day(s).  3.  Patient will perform sit to stand with supervision/set-up within 7 day(s).  4.  Patient will ambulate with supervision/set-up for 100 feet with the least restrictive device within 7 day(s).     physical Therapy EVALUATION  Patient: Jenny [name] (84 y.o. female)  Date: 08/07/2017  Primary Diagnosis: Falls  Frequent falls  Elevated troponin  Fall  Elevated troponin       Precautions:   Fall    ASSESSMENT :  Based on the objective data described below, the patient presents with decreased safety awareness and decreased insight into her deficits, decreased generalized strength, balance, and impaired functional mobility below her baseline level.  Pt reports she uses a rollator for ambulation and lives alone.  She had 2 reported falls prior to admission.  Pt remains at risk for falls as suggested by the Tinetti assessment.  Pt is agreeable to rehab in SNF facility however she wants to return home for a few days to see her dog.  Explained to pt that she is not safe to be at home alone and would need someone to stay with her.  Pt is currently supervision level for transfers and contact guard assist x 1 for ambulation with a rolling walker x 12 feet. She declined ambulating further into the hall with her gown on. Pt remained up in chair at end of session.  Recommend SNF to maximize safe, functional independence.  Pt ultimately may need a housing change to an assisted living facility.      Patient will benefit from skilled intervention to address the above impairments.  Patient???s rehabilitation potential is considered to be Good   Factors which may influence rehabilitation potential include:   []          None noted  []          Mental ability/status  []          Medical condition  [x]          Home/family situation and support systems  [x]          Safety awareness  []          Pain tolerance/management  [x]          Other: fall risk     PLAN :  Recommendations and Planned Interventions:  [x]            Bed Mobility Training             []     Neuromuscular Re-Education  [x]            Transfer Training                   []     Orthotic/Prosthetic Training  [x]            Gait Training                         []     Modalities  []            Therapeutic Exercises           []   Edema Management/Control  []            Therapeutic Activities            [x]     Patient and Family Training/Education  []            Other (comment):    Frequency/Duration: Patient will be followed by physical therapy  5 times a week to address goals.  Discharge Recommendations: Skilled Nursing Facility  Further Equipment Recommendations for Discharge: owns rolling walker and rollator      SUBJECTIVE:   Patient stated ???I want to go home a few days so I can see my dog(before going to SNF).???    OBJECTIVE DATA SUMMARY:   HISTORY:    Past Medical History:   Diagnosis Date   ??? Arrhythmia     atrial Fib   ??? Arrhythmia     SSS   ??? Arthritis    ??? GERD (gastroesophageal reflux disease)    ??? Hypertension    ??? Thyroid disease     hypo     Past Surgical History:   Procedure Laterality Date   ??? ABDOMEN SURGERY PROC UNLISTED  > 30 years ago    partial colectomy; pt not sure why   ??? HX ADENOIDECTOMY     ??? HX ANKLE FRACTURE TX Left 1992    left   ??? HX COLONOSCOPY     ??? HX MOHS PROCEDURES Right 1996    right   ??? HX ORTHOPAEDIC  1994 and 1996    back surgery with rods   ??? HX ORTHOPAEDIC Bilateral     Bil knee replacements ; diff years   ??? HX ORTHOPAEDIC Right 1997    carpal tunnel   ??? HX PACEMAKER     ??? HX TAH AND BSO     ??? HX TONSILLECTOMY        Prior Level of Function/Home Situation: ambulates with a rollator, had recent falls, discharged from SNF recently   Personal factors and/or comorbidities impacting plan of care: lives alone, fall risk    Home Situation  Home Environment: Private residence  # Steps to Enter: 3  Rails to Enter: Yes  Secondary school teacher : Bilateral  One/Two Story Residence: One story  Living Alone: Yes  Support Systems: Family member(s)  Patient Expects to be Discharged to:: Skilled nursing facility  Current DME Used/Available at Home: Environmental consultant, rollator, Environmental consultant, rolling, Commode, bedside, Paediatric nurse  Tub or Shower Type: Shower  EXAMINATION/PRESENTATION/DECISION MAKING: Critical Behavior:  Neurologic State: Alert  Orientation Level: Oriented to person, Oriented to place, Oriented to time, Disoriented to situation  Cognition: Follows commands  Safety/Judgement: decreased awareness of safety precautions, decreased insight into deficits   Hearing:  Auditory  Auditory Impairment: Hard of hearing, bilateralSkin:  intact    Range Of Motion:   within functional limits                        Strength:     generally decreased, functional, tremulous                     Tone & Sensation:    intact                              Coordination:   decreased, functional        Functional Mobility:  Bed Mobility:     Supine to Sit: Supervision;Assist  x1     Scooting: Supervision;Assist x1  Transfers:  Sit to Stand: Contact guard assistance;Assist x1  Stand to Sit: Contact guard assistance;Assist x1                       Balance:   Sitting: Intact  Standing: Impaired  Standing - Static: Good  Standing - Dynamic : FairAmbulation/Gait Training:Distance (ft): 12 Feet (ft)  Assistive Device: Gait belt;Walker, rolling  Ambulation - Level of Assistance: Contact guard assistance;Assist x1        Gait Abnormalities: Decreased step clearance              Speed/Cadence: Slow  Step Length: Left shortened;Right shortened                  Functional Measure:  Tinetti test:     Sitting Balance: 1  Arises: 1  Attempts to Rise: 1  Immediate Standing Balance: 1  Standing Balance: 1  Nudged: 0  Eyes Closed: 0  Turn 360 Degrees - Continuous/Discontinuous: 1  Turn 360 Degrees - Steady/Unsteady: 1  Sitting Down: 2  Balance Score: 9  Indication of Gait: 1  R Step Length/Height: 1  L Step Length/Height: 1  R Foot Clearance: 1  L Foot Clearance: 1  Step Symmetry: 1  Step Continuity: 1  Path: 1  Trunk: 1  Walking Time: 1  Gait Score: 10  Total Score: 19        Tinetti Tool Score Risk of Falls  <19 = High Fall Risk  19-24 = Moderate Fall Risk  25-28 = Low Fall Risk  Tinetti ME. Performance-Oriented Assessment of Mobility Problems in Elderly Patients. JAGS 1986; [id number]. (Scoring Description: PT Bulletin Feb. 10, 1993)    Older adults: Lonn Georgia et al, 2009; n = 1000 [place] elderly evaluated with ABC, POMA, ADL, and IADL)  ?? Mean POMA score for males aged 65-79 years = 26.21(3.40)  ?? Mean POMA score for females age 68-79 years = 25.16(4.30)  ?? Mean POMA score for males over 80 years = 23.29(6.02)  ?? Mean POMA score for females over 80 years = 17.20(8.32)            Physical Therapy Evaluation Charge Determination   History Examination Presentation Decision-Making   MEDIUM  Complexity : 1-2 comorbidities / personal factors will impact the outcome/ POC  MEDIUM Complexity : 3 Standardized tests and measures addressing body structure, function, activity limitation and / or participation in recreation  LOW Complexity : Stable, uncomplicated  MEDIUM Complexity : FOTO score of 26-74      Based on the above components, the patient evaluation is determined to be of the following complexity level: LOW     Pain:  No c/o pain    Activity Tolerance:   Fair, self limiting behavior      After treatment:   [x]          Patient left in no apparent distress sitting up in chair  []          Patient left in no apparent distress in bed  [x]          Call bell left within reach  [x]          Nursing notified   []          Caregiver present  []          Bed alarm activated    COMMUNICATION/EDUCATION:   The patient???s plan of care was discussed with: Registered Nurse.  [  x]         Fall prevention education was provided and the patient/caregiver indicated understanding.  []          Patient/family have participated as able in goal setting and plan of care.  [x]          Patient/family agree to work toward stated goals and plan of care.  []          Patient understands intent and goals of therapy, but is neutral about his/her participation.  []          Patient is unable to participate in goal setting and plan of care.    Thank you for this referral.  Hlee Fringer Celene SkeenS Malita Ignasiak   Time Calculation: 11 mins

## 2017-08-07 NOTE — Progress Notes (Signed)
Bedside shift change report given to Zollie Scalelivia RN (oncoming nurse) by Glee ArvinLatoya RN (offgoing nurse). Report included the following information SBAR, Kardex, ED Summary, Intake/Output, MAR and Recent Results.

## 2017-08-07 NOTE — Progress Notes (Signed)
Pt refused stress testing this morning. Denies chest pain, pressure or SOB.   Dr. Loa Sockso updated and stress cancelled in computer  Tele stopped (v-paced on tele without events noted overnight)  Patient to work with PT this morning (she is agreeable) and her plan is to go HOME. She has a walker and a rollator at home.     Discussed with nurse, attending and case manager.

## 2017-08-07 NOTE — Progress Notes (Signed)
Occupational Therapy 1439 -   02.04.2019    Chart reviewed in prep for OT treatment, patient in process of being moved to new room. Will f/u later in PM as able and appropriate for OT. Thank you.     GrenadaBrittany L. Aretha ParrotFalkenau, MS, OTR/L

## 2017-08-07 NOTE — Progress Notes (Signed)
Had long talk with patient who insisted she wants to go home yet needed help getting to bedside commode this AM. Patient was provided the number to [facility] per request for she advised she could pay for a wheelchair van. [facility] and [facility]. Patient advised her children are against her going home and will not transport. Patient kept insisting that she would go home then consider going to [facility] in a few days. Informed [facility] and if she decided to attempt rehab in a nursing center she would need to choose a different facility. Patient became argumentative and demanded the number for [facility]. Advised [facility] due to concerns regarding her disposition. Tried to encourage patient to consider help in the home but patient declined. Encouraged patient to arrange her own transport and this writer would send home health resumption orders to [facility] as patient has been discharged. Once patient learned she was discharged she stated "the doctor can reverse those orders". Advised patient would need to make arrangements for discharge and patient then advised she would agree to stress test so that doctor would have to cancel the discharge. Attending has been notified.     [phone]

## 2017-08-07 NOTE — Progress Notes (Signed)
Pharmacist Note ??? Warfarin Dosing  Consult provided for this 84 y.o.female to manage warfarin for Atrial Fibrillation    INR Goal: 2 - 3  Home regimen/ tablet size: 1 mg by mouth daily    Drugs that may increase INR:  None  Drugs that may decrease INR: None  Other current anticoagulants/ drugs that may increase bleeding risk: None  Risk factors: Age > 65  Daily INR ordered: YES    Recent Labs     08/07/17  0432 08/06/17  0425 08/05/17  0145 08/04/17  1358   HGB  --   --  13.4 14.4   INR 1.5* 2.3* 3.1* 2.7*     Date               INR                  Dose  2/01  2.7  1 mg   2/02  3.1  Held   2/03  2.3  Held   2/04  1.5  1 mg                                                                                Assessment/ Plan:  Will order warfarin 1 mg PO x 1 dose.  Discussed case with team.  Patient likely had slight elevation of INR 2/2 cefuroxime and renal insufficiency.      Pharmacy will continue to monitor daily and adjust therapy as indicated.  Please contact the pharmacist at x [phone] or [phone] for outpatient recommendations if needed.

## 2017-08-07 NOTE — Progress Notes (Signed)
Bedside shift change report given to Lesley (oncoming nurse) by Cathy (offgoing nurse). Report included the following information SBAR.

## 2017-08-07 NOTE — Progress Notes (Signed)
Cardiology Progress Note                                        Admit Date: 08/04/2017    Assessment/Plan:     Falls; unclear etiology; she refused to have a stress test  Chronic afib; rate is controlled; I will sign off as I have little input       Jenny [name] Lambson is a 84 y.o. female with     PROBLEM LIST:  Patient Active Problem List    Diagnosis Date Noted   ??? Pulmonary hypertension (HCC) 11/21/2011     Priority: 1 - One   ??? Elevated troponin 08/04/2017   ??? Gout 08/04/2017   ??? CKD (chronic kidney disease) 08/04/2017   ??? Heart valve disease 08/04/2017   ??? Frequent falls 08/04/2017   ??? Gait instability 08/04/2017   ??? Anticoagulated on Coumadin 08/04/2017         Subjective:     Jenny [name] Ricke reports none.    Visit Vitals  BP 186/77   Pulse 72   Temp 98.2 ??F (36.8 ??C)   Resp 16   Ht 5\' 6"  (1.676 m)   Wt 151 lb (68.5 kg)   SpO2 94%   BMI 24.37 kg/m??     No intake or output data in the 24 hours ending 08/07/17 1517    Objective:      Physical Exam:  HEENT: Perrla, EOMI  Neck: No JVD,  No thyroidmegaly  Resp: CTA bilaterally; No wheezes or rales  CV: irregular s1s2 No murmur no s3  XBM:WUXLAbd:Soft, Nontender  Ext: No edema  Neuro: Alert and oriented; Nonfocal  Skin: Warm, Dry, Intact  Pulses: 2+ DP/PT/Rad      Telemetry: AFIB    Current Facility-Administered Medications   Medication Dose Route Frequency   ??? regadenoson (LEXISCAN) injection 0.4 mg  0.4 mg IntraVENous RAD ONCE   ??? saline peripheral flush soln 20 mL  20 mL InterCATHeter RAD ONCE   ??? Warfarin - Pharmacy to Dose   Other Rx Dosing/Monitoring   ??? warfarin (COUMADIN) tablet 1 mg  1 mg Oral ONCE   ??? docusate sodium (COLACE) capsule 100 mg  100 mg Oral DAILY   ??? sodium chloride (NS) flush 5-40 mL  5-40 mL IntraVENous Q8H   ??? sodium chloride (NS) flush 5-40 mL  5-40 mL IntraVENous PRN   ??? amLODIPine (NORVASC) tablet 2.5 mg  2.5 mg Oral DAILY   ??? colchicine tablet 0.6 mg  0.6 mg Oral DAILY   ??? DULoxetine (CYMBALTA) capsule 60 mg  60 mg Oral DAILY    ??? HYDROcodone-acetaminophen (NORCO) 5-325 mg per tablet 1 Tab  1 Tab Oral Q4H PRN   ??? levothyroxine (SYNTHROID) tablet 88 mcg  88 mcg Oral ACB   ??? predniSONE (DELTASONE) tablet 5 mg  5 mg Oral DAILY WITH BREAKFAST   ??? sodium chloride (NS) flush 5-40 mL  5-40 mL IntraVENous Q8H   ??? sodium chloride (NS) flush 5-40 mL  5-40 mL IntraVENous PRN   ??? ondansetron (ZOFRAN) injection 4 mg  4 mg IntraVENous Q4H PRN         Data Review:   Labs:    Recent Results (from the past 24 hour(s))   PROTHROMBIN TIME + INR    Collection Time: 08/07/17  4:32 AM   Result Value Ref Range    INR  1.5 (H) 0.9 - 1.1      Prothrombin time 15.0 (H) 9.0 - 11.1 sec   METABOLIC PANEL, BASIC    Collection Time: 08/07/17  6:00 AM   Result Value Ref Range    Sodium 139 136 - 145 mmol/L    Potassium 4.2 3.5 - 5.1 mmol/L    Chloride 104 97 - 108 mmol/L    CO2 27 21 - 32 mmol/L    Anion gap 8 5 - 15 mmol/L    Glucose 96 65 - 100 mg/dL    BUN 19 6 - 20 MG/DL    Creatinine 3.24 (H) 0.55 - 1.02 MG/DL    BUN/Creatinine ratio 15 12 - 20      GFR est AA 50 (L) >60 ml/min/1.43m2    GFR est non-AA 41 (L) >60 ml/min/1.86m2    Calcium 8.2 (L) 8.5 - 10.1 MG/DL

## 2017-08-07 NOTE — Progress Notes (Signed)
Bedside and Verbal shift change report given to Lynden Angathy, Charity fundraiserN (Cabin crewoncoming nurse) by Zollie Scalelivia, RN (offgoing nurse). Report included the following information SBAR and MAR.

## 2017-08-07 NOTE — Discharge Summary (Addendum)
Discharge Summary     PATIENT ID: Jenny [name]  MRN: [id number]   DATE OF BIRTH: 02-07-1929    DATE OF ADMISSION: 08/04/2017  1:38 PM    DATE OF DISCHARGE: 08/07/2017  PRIMARY CARE PROVIDER: Maurine SimmeringAvram, Zheni, MD   ATTENDING PHYSICIAN: Luane SchoolAmber Jenkins, MD  DISCHARGING PROVIDER: Ernesta AmbleHolly M Quavon Keisling, NP. To contact this individual call [phone] and ask the operator to page.  If unavailable ask to be transferred the Adult Hospitalist Department.    CONSULTATIONS: ED CONSULT TO SENIOR SERVICES CASE MANAGEMENT  IP CONSULT TO CARDIOLOGY    ADMITTING DIAGNOSES & HOSPITAL COURSE:   Jenny [name] is a 84 yo female with PMH significant for acute gout for which she just was released from rehab for ~ 2 weeks ago, arrhythmia, valvular disease, pulmonary HTN, and GERD. Came to [facility] s/p fall with no LOC and found to have elevated troponin and admitted for further work up.    DISCHARGE DIAGNOSES / PLAN:      Fall:  - She indicated to a previous provider she was not dizzy prior to the fall, so ? hx of this  - Has reason for falls given severe gout to feet, she only endorsed 1 fall since she was released from rehab ~2 weeks PTA  - Denied LOC and no pain with negative head CT  - refused to work with PT 2/3 and 2/4. Pt will be discharging home per her request - declined offer to be assessed for [place]  - pt is a safety concern and high risk of falls. Discussed concern with pt today but she is still adamant about going home. ??    Hx cardiac arrhythmia:  - Pacer placed ~ 9 years ago and battery changed ~ 1 year ago  - no tachyarrhythmia on pace maker interrogation per Dr. Loa Sockso  ??  Elevated troponin (POA):  - Pt declined stress testing - states she just saw Dr. Canary BrimSperry a few months ago and there was nothing wrong with her as there is nothing wrong now.   - trop flat  ??  Severe gout:  - pt declined PT/OT again today  - Continue Colchicine & low dose Prednisone on discharge  ??  CKD, stage 3: Cr improved  ??  Hx HTN: Continue Norvasc   - BP elevated this am but pt is agitated  ??  Hx Hypothyroid: Continue Synthroid -> TSH normal  ??  Chronic Afib:   - rate controlled  - resume home coumadin and home coumadin monitoring  - discussed with pharmacy     FOLLOW UP APPOINTMENTS:    Follow-up Information     Follow up With Specialties Details Why Contact Info    Maurine SimmeringAvram, Zheni, MD Internal Medicine In 1 week  [address]  [address]  [place]  [place] [id number]  [phone]      Waynetta SandySperry, Robert E, MD Cardiology In 2 weeks  [address]  [facility]  [address]  [place] [id number]  [phone]           ADDITIONAL CARE RECOMMENDATIONS:   - recommend checking BP daily and keeping record to review with cardiology    - resume your usual coumadin testing/monitoring as previously set up/scheduled    DIET: Cardiac Diet    ACTIVITY: Activity as tolerated - should use assistive device when walking    EQUIPMENT needed: Walker or Rollator    DISCHARGE MEDICATIONS:  Current Discharge Medication List      CONTINUE  these medications which have NOT CHANGED    Details   predniSONE (DELTASONE) 5 mg tablet Take 5 mg by mouth.      amLODIPine (NORVASC) 2.5 mg tablet Take 2.5 mg by mouth daily.      colchicine (COLCRYS) 0.6 mg tablet Take 0.6 mg by mouth daily.      diclofenac (VOLTAREN) 1 % gel Apply 1 g to affected area.      DULoxetine (CYMBALTA) 60 mg capsule Take 60 mg by mouth.      warfarin (COUMADIN) 1 mg tablet Take 1 mg by mouth daily.      levothyroxine (SYNTHROID) 88 mcg tablet Take 88 mcg by mouth.         STOP taking these medications       AMLODIPINE BESYLATE, BULK, Comments:   Reason for Stopping:         azithromycin (ZITHROMAX) 250 mg tablet Comments:   Reason for Stopping:         cefUROXime (CEFTIN) 500 mg tablet Comments:   Reason for Stopping:         HYDROcodone-acetaminophen (NORCO) 5-325 mg per tablet Comments:   Reason for Stopping:             NOTIFY YOUR PHYSICIAN FOR ANY OF THE FOLLOWING:   Fever over 101 degrees for 24 hours.    Chest pain, shortness of breath, fever, chills, nausea, vomiting, diarrhea, change in mentation, falling, weakness, bleeding. Severe pain or pain not relieved by medications.  Or, any other signs or symptoms that you may have questions about.    DISPOSITION:    Home With:   OT  PT  HH  RN       Long term SNF/Inpatient Rehab    Independent/assisted living    Hospice   xx Other: Home     PATIENT CONDITION AT DISCHARGE:   Functional status: unknown specific ambulation status, uses walker and rollator at home. Pt refused therapy x 2.   Poor    xx Deconditioned    xx Independent      Cognition   xx  Lucid     Forgetful     Dementia      Catheters/lines (plus indication)    Foley     PICC     PEG    xx None      Code status    xx Full code     DNR      PHYSICAL EXAMINATION AT DISCHARGE:  BP 186/77    Pulse 72    Temp 98.2 ??F (36.8 ??C)    Resp 16    Ht 5\' 6"  (1.676 m)    Wt 68.5 kg (151 lb)    SpO2 94%    BMI 24.37 kg/m??     Pt in bed, still adamant that she does not need a stress test - she wants to go home to be with her dog for a bit and then would consider [place]. We discussed her safety and need to be assessed for her balance and ability to walk - she agreed to see and work with PT today. 15 minutes later, she refused PT. When asked, she indicates she is [name] - does not need PT and ready to go home. She does not consent to contact her son or daughter. Attending also reached out to her listing the safety concerns and pt still adamant about discharge, knowing this could be unsafe.     Followed up with cardiologist, at  this point, patient may have stress testing outpatient if her cardiologist feels it is warranted. She sees Dr. Canary Brim.    Constitutional:  No acute distress. ??   ENT:  Oral MM moist, oropharynx benign.     Resp:  No accessory muscle use and on RA   CV:  Regular rhythm, Vpaced on tele    Musculoskeletal:  No edema, warm, 2+ pulses throughout. Malformation of feet (nontender)     Neurologic:  Moves all extremities.  AAOx3, CN II-XII reviewed. No focal deficits   Psych: Upset/agitated   ??  CHRONIC MEDICAL DIAGNOSES:  Problem List as of 08-08-2017 Date Reviewed: Aug 08, 2017          Codes Class Noted - Resolved    Pulmonary hypertension (HCC) ICD-10-CM: I27.20  ICD-9-CM: 416.8  11/21/2011 - Present        Elevated troponin ICD-10-CM: R74.8  ICD-9-CM: 790.6  08/04/2017 - Present        Gout ICD-10-CM: M10.9  ICD-9-CM: 274.9  08/04/2017 - Present        CKD (chronic kidney disease) ICD-10-CM: N18.9  ICD-9-CM: 585.9  08/04/2017 - Present        Heart valve disease ICD-10-CM: I38  ICD-9-CM: 424.90  08/04/2017 - Present        Frequent falls ICD-10-CM: R29.6  ICD-9-CM: V15.88  08/04/2017 - Present        Gait instability ICD-10-CM: R26.81  ICD-9-CM: 781.2  08/04/2017 - Present        Anticoagulated on Coumadin ICD-10-CM: Z51.81, Z79.01  ICD-9-CM: V58.83, V58.61  08/04/2017 - Present        RESOLVED: Fall ICD-10-CM: W19.Lorne Skeens  ICD-9-CM: E888.9  2017/08/08 - Aug 08, 2017        * (Principal) RESOLVED: Falls ICD-10-CM: N62.Lorne Skeens  ICD-9-CM: X528.4  08/04/2017 - Aug 08, 2017            Greater than 30 minutes were spent with the patient on counseling and coordination of care    Signed:   Ernesta Amble, NP  2017-08-08  9:33 AM

## 2017-08-07 NOTE — Discharge Summary (Signed)
Discharge Summary by Ernesta Amble, NP at 08/07/17 [phone]                Author: Ernesta Amble, NP  Service: Nurse Practitioner  Author Type: Nurse Practitioner       Filed: 08/07/17 2240  Date of Service: 08/07/17 0933  Status: Addendum          Editor: Ernesta Amble, NP (Nurse Practitioner)       Related Notes: Original Note by Ernesta Amble, NP (Nurse Practitioner) filed at 08/07/17 1039          Cosigner: Conrad Burlington, DO at 08/09/17 1047                       Discharge Summary        PATIENT ID: Jenny [name]   MRN: [id number]     DATE OF BIRTH: 1928-07-18      DATE OF ADMISSION: 08/04/2017  1:38 PM      DATE OF DISCHARGE:  08/07/2017   PRIMARY CARE PROVIDER:  Maurine Simmering, MD    ATTENDING PHYSICIAN: Luane School, MD   DISCHARGING PROVIDER:  Ernesta Amble, NP. To contact this individual call [phone] and ask the operator to page.  If unavailable ask to be transferred the  Adult Hospitalist Department.      CONSULTATIONS: ED CONSULT TO SENIOR  SERVICES CASE MANAGEMENT   IP CONSULT TO CARDIOLOGY      ADMITTING DIAGNOSES & HOSPITAL  COURSE:    Jenny [name] is a 84 yo female with PMH significant for acute gout for which she just was released from rehab for ~ 2 weeks ago, arrhythmia, valvular  disease, pulmonary HTN, and GERD. Came to SPED s/p fall with no LOC and found to have elevated troponin and admitted for further work up.        DISCHARGE DIAGNOSES / P LAN:           Fall:   - She indicated to a previous provider she was not dizzy prior to the fall, so ? hx of this   - Has reason for falls given severe gout to feet, she only endorsed 1 fall since she was released from rehab ~2 weeks PTA   - Denied LOC and no pain with negative head CT   - refused to work with PT 2/3 and 2/4. Pt will be discharging home per her request - declined offer to be assessed for [place]   - pt is a safety concern and high risk of falls. Discussed concern with pt today but she is still adamant about going home. ??       Hx cardiac arrhythmia:   - Pacer placed ~ 9 years ago and battery changed ~ 1 year ago   - no tachyarrhythmia on pace maker interrogation per Dr. Loa Socks   ??   Elevated troponin (POA):   - Pt declined stress testing - states she just saw Dr. Canary Brim a few months ago and there was nothing wrong with her as there is nothing wrong now.    - trop flat   ??   Severe gout:   - pt declined PT/OT again today   - Continue Colchicine & low dose Prednisone on discharge   ??   CKD, stage 3: Cr improved   ??   Hx HTN: Continue Norvasc   - BP elevated this am but pt is agitated   ??   Hx  Hypothyroid: Continue Synthroid -> TSH normal   ??   Chronic Afib:    - rate controlled   - resume home coumadin and home coumadin monitoring   - discussed with pharmacy        FOLLOW UP APPOINTMENTS:       Follow-up Information               Follow up With  Specialties  Details  Why  Contact Info              Maurine SimmeringAvram, Zheni, MD  Internal Medicine  In 1 week    [address]   [address]   [place]  [place] [id number]   [phone]                 Waynetta SandySperry, Robert E, MD  Cardiology  In 2 weeks    [address]   [facility]   [address]   [place] [id number]   [phone]                 ADDITIONAL CARE RECOMMENDATIONS:    - recommend checking BP daily and keeping record to review with cardiology      - resume your usual coumadin testing/monitoring as previously set up/scheduled      DIET: Cardiac Diet      ACTIVITY: Activity as tolerated - should use assistive device when walking      EQUIPMENT needed: Walker or Rollator      DISCHARGE MEDICATIONS:     Current Discharge Medication List              CONTINUE these medications which have NOT CHANGED          Details        predniSONE (DELTASONE) 5 mg tablet  Take 5 mg by mouth.               amLODIPine (NORVASC) 2.5 mg tablet  Take 2.5 mg by mouth daily.               colchicine (COLCRYS) 0.6 mg tablet  Take 0.6 mg by mouth daily.               diclofenac (VOLTAREN) 1 % gel  Apply 1 g to  affected area.               DULoxetine (CYMBALTA) 60 mg capsule  Take 60 mg by mouth.               warfarin (COUMADIN) 1 mg tablet  Take 1 mg by mouth daily.               levothyroxine (SYNTHROID) 88 mcg tablet  Take 88 mcg by mouth.                     STOP taking these medications                  AMLODIPINE BESYLATE, BULK,  Comments:    Reason for Stopping:                      azithromycin (ZITHROMAX) 250 mg tablet  Comments:    Reason for Stopping:                      cefUROXime (CEFTIN) 500 mg tablet  Comments:    Reason for Stopping:  HYDROcodone-acetaminophen (NORCO) 5-325 mg per tablet  Comments:    Reason for Stopping:                          NOTIFY YOUR PHYSICIAN FOR ANY OF THE FOLLOWING:     Fever over 101 degrees for 24 hours.    Chest pain, shortness of breath, fever, chills, nausea, vomiting, diarrhea, change in mentation, falling, weakness, bleeding. Severe pain or pain not relieved by medications.   Or, any other signs or symptoms that you may have questions about.      DISPOSITION:         Home With:     OT    PT    HH    RN                   Long term SNF/Inpatient Rehab       Independent/assisted living          Hospice        xx  Other: Home        PATIENT CONDITION AT DISCHARGE:    Functional status: unknown specific ambulation status, uses walker and rollator at home. Pt refused therapy x 2.        Poor      xx  Deconditioned         xx  Independent         Cognition       xx   Lucid        Forgetful           Dementia         Catheters/lines (plus indication)         Foley        PICC        PEG         xx  None         Code status        xx  Full code           DNR         PHYSICAL EXAMINATION AT DISCHARGE:   BP 186/77    Pulse 72    Temp 98.2 ??F (36.8 ??C)    Resp 16    Ht 5\' 6"  (1.676 m)    Wt 68.5 kg (151 lb)    SpO2 94%    BMI 24.37 kg/m??       Pt in bed, still adamant that she does not need a stress test - she wants to go home to be with her dog for a bit and  then would consider [place]. We discussed her  safety and need to be assessed for her balance and ability to walk - she agreed to see and work with PT today. 15 minutes later, she refused PT. When asked, she indicates she is fine - does not need PT and ready to go home. She does not consent to contact  her son or daughter. Attending also reached out to her listing the safety concerns and pt still adamant about discharge, knowing this could be unsafe.       Followed up with cardiologist, at this point, patient may have stress testing outpatient if her cardiologist feels it is warranted. She sees Dr. Canary Brim.         Constitutional:   No acute distress. ??     ENT:   Oral MM moist, oropharynx benign.  Resp:   No accessory muscle use and on RA     CV:   Regular rhythm, Vpaced on tele      Musculoskeletal:   No edema, warm, 2+ pulses throughout. Malformation of feet (nontender)      Neurologic:   Moves all extremities.  AAOx3, CN II-XII reviewed. No focal deficits     Psych:  Upset/agitated     ??   CHRONIC MEDICAL DIAGNOSES:      Problem List  as of 09/03/17  Date Reviewed:  09/03/2017                        Codes  Class  Noted - Resolved             Pulmonary hypertension (HCC)  ICD-10-CM: I27.20   ICD-9-CM: 416.8    11/21/2011 - Present                       Elevated troponin  ICD-10-CM: R74.8   ICD-9-CM: 790.6    08/04/2017 - Present                       Gout  ICD-10-CM: M10.9   ICD-9-CM: 274.9    08/04/2017 - Present                       CKD (chronic kidney disease)  ICD-10-CM: N18.9   ICD-9-CM: 585.9    08/04/2017 - Present                       Heart valve disease  ICD-10-CM: I38   ICD-9-CM: 424.90    08/04/2017 - Present                       Frequent falls  ICD-10-CM: R29.6   ICD-9-CM: V15.88    08/04/2017 - Present                       Gait instability  ICD-10-CM: R26.81   ICD-9-CM: 781.2    08/04/2017 - Present                       Anticoagulated on Coumadin  ICD-10-CM: Z51.81, Z79.01   ICD-9-CM: V58.83, V58.61     08/04/2017 - Present                       RESOLVED: Fall  ICD-10-CM: W19.Lorne Skeens   ICD-9-CM: E888.9    2017/09/03 - Sep 03, 2017                       * (Principal) RESOLVED: Falls  ICD-10-CM: U98.Lorne Skeens   ICD-9-CM: J191.4    08/04/2017 - 09-03-17                       Greater than 30 minutes were spent with the patient on counseling and coordination of care      Signed:    Ernesta Amble, NP   09/03/17   9:33 AM

## 2017-08-08 ENCOUNTER — Inpatient Hospital Stay: Admit: 2017-08-08 | Payer: MEDICARE | Primary: Internal Medicine

## 2017-08-08 ENCOUNTER — Inpatient Hospital Stay

## 2017-08-08 LAB — GLUCOSE, POC
Glucose (POC): 112 mg/dL — ABNORMAL HIGH (ref 65–100)
Glucose (POC): 96 mg/dL (ref 65–100)

## 2017-08-08 LAB — PROTHROMBIN TIME + INR
INR: 1.3 — ABNORMAL HIGH (ref 0.9–1.1)
Prothrombin time: 12.7 s — ABNORMAL HIGH (ref 9.0–11.1)

## 2017-08-08 MED ORDER — AMLODIPINE 5 MG TAB
5 mg | Freq: Every day | ORAL | Status: DC
Start: 2017-08-08 — End: 2017-08-08
  Administered 2017-08-08: 14:00:00 via ORAL

## 2017-08-08 MED ORDER — HYDRALAZINE 20 MG/ML IJ SOLN
20 mg/mL | Freq: Once | INTRAMUSCULAR | Status: AC
Start: 2017-08-08 — End: 2017-08-08
  Administered 2017-08-08: 15:00:00 via INTRAVENOUS

## 2017-08-08 MED ORDER — AMLODIPINE 5 MG TAB
5 mg | ORAL | Status: AC
Start: 2017-08-08 — End: 2017-08-08
  Administered 2017-08-08: 18:00:00 via ORAL

## 2017-08-08 MED ORDER — AMLODIPINE 5 MG TAB
5 mg | Freq: Every day | ORAL | Status: DC
Start: 2017-08-08 — End: 2017-08-12
  Administered 2017-08-09 – 2017-08-12 (×4): via ORAL

## 2017-08-08 MED ORDER — ACETAMINOPHEN 325 MG TABLET
325 mg | ORAL | Status: DC | PRN
Start: 2017-08-08 — End: 2017-08-12
  Administered 2017-08-08 – 2017-08-12 (×4): via ORAL

## 2017-08-08 MED ORDER — WARFARIN 2 MG TAB
2 mg | Freq: Once | ORAL | Status: AC
Start: 2017-08-08 — End: 2017-08-08
  Administered 2017-08-08: 23:00:00 via ORAL

## 2017-08-08 MED FILL — HYDRALAZINE 20 MG/ML IJ SOLN: 20 mg/mL | INTRAMUSCULAR | Qty: 1

## 2017-08-08 MED FILL — WARFARIN 1 MG TAB: 1 mg | ORAL | Qty: 1

## 2017-08-08 MED FILL — DULOXETINE 60 MG CAP, DELAYED RELEASE: 60 mg | ORAL | Qty: 1

## 2017-08-08 MED FILL — PREDNISONE 5 MG TAB: 5 mg | ORAL | Qty: 1

## 2017-08-08 MED FILL — HYDROCODONE-ACETAMINOPHEN 5 MG-325 MG TAB: 5-325 mg | ORAL | Qty: 1

## 2017-08-08 MED FILL — NORMAL SALINE FLUSH 0.9 % INJECTION SYRINGE: INTRAMUSCULAR | Qty: 50

## 2017-08-08 MED FILL — ACETAMINOPHEN 325 MG TABLET: 325 mg | ORAL | Qty: 2

## 2017-08-08 MED FILL — AMLODIPINE 5 MG TAB: 5 mg | ORAL | Qty: 1

## 2017-08-08 MED FILL — DOK 100 MG CAPSULE: 100 mg | ORAL | Qty: 1

## 2017-08-08 MED FILL — LEVOTHYROXINE 88 MCG TAB: 88 mcg | ORAL | Qty: 1

## 2017-08-08 MED FILL — COLCHICINE 0.6 MG TAB: 0.6 mg | ORAL | Qty: 1

## 2017-08-08 NOTE — Progress Notes (Signed)
Pharmacist Note ??? Warfarin Dosing  Consult provided for this 84 y.o.female to manage warfarin for Atrial Fibrillation    INR Goal: 2 - 3  Home regimen/ tablet size: 1 mg by mouth daily    Drugs that may increase INR:  None  Drugs that may decrease INR: None  Other current anticoagulants/ drugs that may increase bleeding risk: None  Risk factors: Age > 65  Daily INR ordered: YES    Recent Labs     08/08/17  1600 08/07/17  0432 08/06/17  0425   INR 1.3* 1.5* 2.3*     Date               INR                  Dose  2/01  2.7  1 mg   2/02  3.1  Held   2/03  2.3  Held   2/04  1.5  1 mg   2/05  1.3  2.5 mg                                                                               Assessment/ Plan:  Will order warfarin 2.5 mg PO x 1 dose.  Likely seeing response to 2 consecutive days of holding warfarin.  Will give boost dose Tonight.    Pharmacy will continue to monitor daily and adjust therapy as indicated.  Please contact the pharmacist at [phone] or [phone] for outpatient recommendations if needed.

## 2017-08-08 NOTE — Progress Notes (Signed)
Hospitalist Progress Note  Jenny Amble, NP  Answering service: [phone] OR [phone] from in house phone  Cell: [phone]      Date of Service:  08/08/2017  NAME:  Jenny [name]  DOB:  [birth year]  MRN:  [id number]    Admission Summary:   Jenny [name] is a 84 yo female with PMH significant for acute gout for which she just was released from rehab for ~ 2 weeks ago, arrhythmia, valvular disease, pulmonary HTN, and GERD. Came to [facility] s/p fall with no LOC and found to have elevated troponin and admitted for further work up.  ??  Interval history / Subjective:   Pt in bed, very apologetic about her mannerism yesterday> She is frightened about her discharge dispo - agreeable to SNF but wants to go home for a night to spend time with her dog. Okay to first find a SNF that will accept her for admit and then we will try to work out details. Her son or daughter has to be willing to stay with her overnight.     Pt was discharged to home yesterday but pt declined to go. Not sure she knows what she wants to do but we discussed staying in the hospital was not an option - needs to work with PT and she agreed.    Discharge cancelled this am as we are treating her BP     Assessment & Plan:     Fall:  - She??indicated to a previous provider she was??not dizzy prior to the fall, so ? hx of this  - Has reason for falls given severe gout to feet, she only endorsed 1 fall since she was released from rehab ~2 weeks??PTA  - Denied LOC and no pain with negative head CT  - needs to go back to SNF  ??  Hx??cardiac arrhythmia:  - Pacer placed ~ 9 years ago and battery changed ~ 1 year ago  -??no tachyarrhythmia on pace maker interrogation per Dr. Loa Socks  ??  Elevated troponin (POA):  - Pt declined stress testing   -??trop flat  - discussed with Dr. Loa Socks (2/4 evening), could pursue stress test outpatient  ??  Severe gout:  -??needs PT/OT  - Continue Colchicine & low dose Prednisone on discharge  ??   CKD, stage 3:??Cr improved  ??  Hx??HTN:??Continue Norvasc (increase dose today)  - received a dose of hydralazine today due to elevated BP  ??  Hx??Hypothyroid:??Continue Synthroid ->??TSH normal  ??  Chronic Afib:   - rate controlled  - pharmacy dosing coumadin, last INR 1.3    Code status: Full  DVT prophylaxis: on coumadin  Care Plan discussed with: patient, case manager and nurse  Disposition: to be determined.      Hospital Problems  Date Reviewed: 2017/08/22          Codes Class Noted POA    Pulmonary hypertension (HCC) ICD-10-CM: I27.20  ICD-9-CM: 416.8  11/21/2011 Yes        Elevated troponin ICD-10-CM: R74.8  ICD-9-CM: 790.6  08/04/2017 Unknown        Gout ICD-10-CM: M10.9  ICD-9-CM: 274.9  08/04/2017 Unknown        CKD (chronic kidney disease) ICD-10-CM: N18.9  ICD-9-CM: 585.9  08/04/2017 Unknown        Heart valve disease ICD-10-CM: I38  ICD-9-CM: 424.90  08/04/2017 Unknown        Frequent falls ICD-10-CM: R29.6  ICD-9-CM: V15.88  08/04/2017 Unknown  Gait instability ICD-10-CM: R26.81  ICD-9-CM: 781.2  08/04/2017 Unknown        Anticoagulated on Coumadin ICD-10-CM: Z51.81, Z79.01  ICD-9-CM: V58.83, V58.61  08/04/2017 Unknown            Review of Systems:   Denies HA. No chest pain or pressure. No respiratory complaints but + nausea at beginning of assessment that cleared by end of visit    Vital Signs:    Last 24hrs VS reviewed since prior progress note. Most recent are:  Visit Vitals  BP 162/76   Pulse 70   Temp 98.6 ??F (37 ??C)   Resp 16   Ht 5\' 6"  (1.676 m)   Wt 68.5 kg (151 lb)   SpO2 96%   BMI 24.37 kg/m??     No intake or output data in the 24 hours ending 08/08/17 2331     Physical Examination:       Constitutional: ??No acute distress.????   ENT: ??Oral MM moist, oropharynx benign. ??   Resp: ??No accessory muscle use and on RA   CV: ??Regular rhythm,??Vpaced on tele   ??Musculoskeletal: ??No edema, warm, 2+ pulses throughout. Malformation of feet (nontender)    ??Neurologic: ??Moves all extremities. ??AAOx3, CN II-XII reviewed. No focal deficits   Psych: tearful     Data Review:   Review and/or order of tests in the medicine section of CPT    Labs:   No results for input(s): WBC, HGB, HCT, PLT, HGBEXT, HCTEXT, PLTEXT in the last 72 hours.  Recent Labs     08/07/17  0600   NA 139   K 4.2   CL 104   CO2 27   BUN 19   CREA 1.23*   GLU 96   CA 8.2*     No results for input(s): SGOT, GPT, ALT, AP, TBIL, TBILI, TP, ALB, GLOB, GGT, AML, LPSE in the last 72 hours.    No lab exists for component: AMYP, HLPSE  Recent Labs     08/08/17  1600 08/07/17  0432 08/06/17  0425   INR 1.3* 1.5* 2.3*   PTP 12.7* 15.0* 21.9*      No results for input(s): FE, TIBC, PSAT, FERR in the last 72 hours.   No results found for: FOL, RBCF   No results for input(s): PH, PCO2, PO2 in the last 72 hours.  No results for input(s): CPK, CKNDX, TROIQ in the last 72 hours.    No lab exists for component: CPKMB  Lab Results   Component Value Date/Time    Cholesterol, total 191 08/05/2017 01:45 AM    HDL Cholesterol 64 08/05/2017 01:45 AM    LDL, calculated 101.6 (H) 16/10/960402/08/2017 01:45 AM    Triglyceride 127 08/05/2017 01:45 AM    CHOL/HDL Ratio 3.0 08/05/2017 01:45 AM     Lab Results   Component Value Date/Time    Glucose (POC) 96 08/08/2017 04:31 PM    Glucose (POC) 112 (H) 08/07/2017 08:58 PM    Glucose (POC) 90 08/07/2017 05:01 PM     Lab Results   Component Value Date/Time    Color YELLOW/STRAW 08/04/2017 03:18 PM    Appearance CLEAR 08/04/2017 03:18 PM    Specific gravity 1.010 08/04/2017 03:18 PM    pH (UA) 5.5 08/04/2017 03:18 PM    Protein NEGATIVE  08/04/2017 03:18 PM    Glucose NEGATIVE  08/04/2017 03:18 PM    Ketone NEGATIVE  08/04/2017 03:18 PM    Bilirubin NEGATIVE  08/04/2017  03:18 PM    Urobilinogen 0.2 08/04/2017 03:18 PM    Nitrites NEGATIVE  08/04/2017 03:18 PM    Leukocyte Esterase NEGATIVE  08/04/2017 03:18 PM    Epithelial cells FEW 08/04/2017 03:18 PM    Bacteria NEGATIVE  08/04/2017 03:18 PM     WBC 0-4 08/04/2017 03:18 PM    RBC 0-5 08/04/2017 03:18 PM         Medications Reviewed:     Current Facility-Administered Medications   Medication Dose Route Frequency   ??? [START ON 08/09/2017] amLODIPine (NORVASC) tablet 10 mg  10 mg Oral DAILY   ??? acetaminophen (TYLENOL) tablet 650 mg  650 mg Oral Q4H PRN   ??? Warfarin - Pharmacy to Dose   Other Rx Dosing/Monitoring   ??? docusate sodium (COLACE) capsule 100 mg  100 mg Oral DAILY   ??? sodium chloride (NS) flush 5-40 mL  5-40 mL IntraVENous Q8H   ??? sodium chloride (NS) flush 5-40 mL  5-40 mL IntraVENous PRN   ??? colchicine tablet 0.6 mg  0.6 mg Oral DAILY   ??? DULoxetine (CYMBALTA) capsule 60 mg  60 mg Oral DAILY   ??? HYDROcodone-acetaminophen (NORCO) 5-325 mg per tablet 1 Tab  1 Tab Oral Q4H PRN   ??? levothyroxine (SYNTHROID) tablet 88 mcg  88 mcg Oral ACB   ??? predniSONE (DELTASONE) tablet 5 mg  5 mg Oral DAILY WITH BREAKFAST   ??? sodium chloride (NS) flush 5-40 mL  5-40 mL IntraVENous Q8H   ??? sodium chloride (NS) flush 5-40 mL  5-40 mL IntraVENous PRN   ??? ondansetron (ZOFRAN) injection 4 mg  4 mg IntraVENous Q4H PRN   ______________________________________________________________________  EXPECTED LENGTH OF STAY: 2d 19h  ACTUAL LENGTH OF STAY:          1              Jenny Amble, NP

## 2017-08-08 NOTE — Progress Notes (Addendum)
Physical Therapy  08/08/2017    Chart reviewed. RN cleared pt for PT if BP stable. Pt obtained while pt at rest in bed, BP 165/78 HR 72. Pt agreeable then non-agreeable to PT providing multiple reasoning. Pt initially stated she had not walked, then remembered she had amb some w/RW, then reported that her BP was high and wanted to stay in bed (BP as mentioned above). Pt also repeating "I'm going home" and as well as "if they're sending me to straight to rehab, I'm going home first". Encouraged pt to continue mobility training in order to prepare rehab "Or home" and pt beginning to move legs to EOB, however stated brief needed to be changed because she "is soak and wet" and brought legs back to center. Offered to assist pt w/ brief change while standing EOB w/ RW, pt preferred to stay in bed as "they have been changing me in the bed" and wanted to wait for NSG to help with the change.  Pt deferring any mobility at this time. Will f/u tomorrow as able and appropriate.  RN aware.      Miranda Means, PTA  Time spent: 12 minutes

## 2017-08-08 NOTE — Progress Notes (Signed)
Plan is for stress test today then possible discharge. Patient has refused SNF. Will follow and assist with [facility] health [facility] if patient continues to decline rehab. Bigger problem is patient needs help in the home or placement in ALF but she refuses.    [phone]

## 2017-08-08 NOTE — Progress Notes (Signed)
Patient refused labs this AM. Bedside shift change report given to Boneta LucksJenny RN (oncoming nurse) by Miguel DibbleLesley RN (offgoing nurse). Report included the following information SBAR, Kardex and MAR.

## 2017-08-08 NOTE — Progress Notes (Signed)
Problem: Self Care Deficits Care Plan (Adult)  Goal: *Acute Goals and Plan of Care (Insert Text)  Occupational Therapy Goals  Initiated 08/06/2017   1.  Patient will completed toilet transfer with CG within 7 days.  2.  Patient will complete toileting with min A within 7 days.  3.  Patient will complete dressing from chair level with min A within 7 days.  4.  Patient will complete bathing from chair with min A within 7 days.  5.  Patient will complete grooming activities standing for 3 minutes within 7 days.   6.  Patient will complete light IADL activity with min A within 7 days.    Occupational Therapy TREATMENT  Patient: Jenny [name] (84 y.o. female)  Date: 08/08/2017  Diagnosis: Falls  Frequent falls  Elevated troponin  Fall  Elevated troponin Falls      Precautions: Fall  Chart, occupational therapy assessment, plan of care, and goals were reviewed.    ASSESSMENT:  Patient cleared by RN to be seen for OT session, received in bed and agreeable to treatment. Patient initially agreeable to sitting EOB to complete oral care and grooming, then reporting her brief was soiled. Patient refused to get OOB to [facility] but was supervision to roll in bed L/R for toileting. Patient total A for peri care and to donn new brief. Patient then encouraged to continue with grooming, patient refusing, reporting dizziness. Patient noted to be perseverative on going home to see her dog prior to going to rehab, patient repeated this 2-3 times during ADL tasks. Patient left in bed with call bell in reach, RN aware and all needs met. Patient functioning well below baseline and not safe to go home alone. Will continue to follow.     Recommend with nursing patient to complete as able in order to maintain strength, endurance and independence: ADLs with supervision/setup, OOB to chair 3x/day and mobilizing to the bathroom/BSC for toileting with 1 assist. Thank you for your assistance.     Progression toward goals:   []       Improving appropriately and progressing toward goals  [x]       Improving slowly and progressing toward goals  []       Not making progress toward goals and plan of care will be adjusted     PLAN:  Patient continues to benefit from skilled intervention to address the above impairments.  Continue treatment per established plan of care.  Discharge Recommendations:  [facility]  Further Equipment Recommendations for Discharge:  TBD by rehab     SUBJECTIVE:   Patient stated ???I want to go home just for 1 night to cuddle my dog.???    OBJECTIVE DATA SUMMARY:   Cognitive/Behavioral Status:  Neurologic State: Alert  Orientation Level: Oriented to person;Oriented to place;Oriented to time;Disoriented to situation  Cognition: Appropriate for age attention/concentration;Follows commands;Impaired decision making;Memory loss  Perception: Appears intact  Perseveration: Perseverates during conversation(onm going home to see her dog)  Safety/Judgement: Decreased insight into deficits;Fall prevention  Functional Mobility and Transfers for ADLs:Bed Mobility:  Rolling: Supervision    Transfers:  NT - patient refused to get OOB  Balance:  Sitting: Intact  ADL Intervention:  Toileting  Toileting Assistance: Total assistance(dependent)  Bladder Hygiene: Total assistance (dependent)  Bowel Hygiene: Total assistance (dependent)  Clothing Management: Total assistance (dependent)  Cues: Physical assistance for pants down;Physical assistance for pants up;Verbal cues provided  Adaptive Equipment: Other (comment)(bed rails)    Cognitive Retraining  Safety/Judgement: Decreased insight into deficits;Fall prevention  Activity Tolerance:   Fair, VSS  Please refer to the flowsheet for vital signs taken during this treatment.  After treatment:   [] Patient left in no apparent distress sitting up in chair  [x] Patient left in no apparent distress in bed  [x] Call bell left within reach  [x] Nursing notified  [] Caregiver present   [] Bed alarm activated    COMMUNICATION/COLLABORATION:   The patient???s plan of care was discussed with: Physical Therapist, Registered Nurse and Social Worker    Fidela Salisbury, OT  Time Calculation: 12 mins

## 2017-08-09 LAB — PROTHROMBIN TIME + INR
INR: 1.3 — ABNORMAL HIGH (ref 0.9–1.1)
Prothrombin time: 12.7 s — ABNORMAL HIGH (ref 9.0–11.1)

## 2017-08-09 MED ORDER — DOCUSATE SODIUM 100 MG CAP
100 mg | Freq: Two times a day (BID) | ORAL | Status: DC
Start: 2017-08-09 — End: 2017-08-12
  Administered 2017-08-09 – 2017-08-12 (×5): via ORAL

## 2017-08-09 MED ORDER — HYDRALAZINE 20 MG/ML IJ SOLN
20 mg/mL | Freq: Once | INTRAMUSCULAR | Status: AC
Start: 2017-08-09 — End: 2017-08-09
  Administered 2017-08-09: 06:00:00 via INTRAVENOUS

## 2017-08-09 MED ORDER — WARFARIN 1 MG TAB
1 mg | Freq: Once | ORAL | Status: AC
Start: 2017-08-09 — End: 2017-08-09
  Administered 2017-08-09: 23:00:00 via ORAL

## 2017-08-09 MED ORDER — HYDRALAZINE 20 MG/ML IJ SOLN
20 mg/mL | INTRAMUSCULAR | Status: AC
Start: 2017-08-09 — End: 2017-08-09
  Administered 2017-08-09: 07:00:00

## 2017-08-09 MED ORDER — DICYCLOMINE 10 MG CAP
10 mg | Freq: Three times a day (TID) | ORAL | Status: DC | PRN
Start: 2017-08-09 — End: 2017-08-12

## 2017-08-09 MED FILL — DOK 100 MG CAPSULE: 100 mg | ORAL | Qty: 1

## 2017-08-09 MED FILL — LEVOTHYROXINE 88 MCG TAB: 88 mcg | ORAL | Qty: 1

## 2017-08-09 MED FILL — DULOXETINE 60 MG CAP, DELAYED RELEASE: 60 mg | ORAL | Qty: 1

## 2017-08-09 MED FILL — NORMAL SALINE FLUSH 0.9 % INJECTION SYRINGE: INTRAMUSCULAR | Qty: 10

## 2017-08-09 MED FILL — AMLODIPINE 5 MG TAB: 5 mg | ORAL | Qty: 2

## 2017-08-09 MED FILL — HYDROCODONE-ACETAMINOPHEN 5 MG-325 MG TAB: 5-325 mg | ORAL | Qty: 1

## 2017-08-09 MED FILL — ONDANSETRON (PF) 4 MG/2 ML INJECTION: 4 mg/2 mL | INTRAMUSCULAR | Qty: 2

## 2017-08-09 MED FILL — PREDNISONE 5 MG TAB: 5 mg | ORAL | Qty: 1

## 2017-08-09 MED FILL — HYDRALAZINE 20 MG/ML IJ SOLN: 20 mg/mL | INTRAMUSCULAR | Qty: 1

## 2017-08-09 MED FILL — WARFARIN 1 MG TAB: 1 mg | ORAL | Qty: 1

## 2017-08-09 MED FILL — COLCHICINE 0.6 MG TAB: 0.6 mg | ORAL | Qty: 1

## 2017-08-09 MED FILL — ACETAMINOPHEN 325 MG TABLET: 325 mg | ORAL | Qty: 2

## 2017-08-09 NOTE — Consults (Signed)
Jenny [name] is a 84 y.o.  female with h/o gout,cardiac arrhythmia,valvular disease,pulm hypertension,hypothyroidism,gerd who is a transfer from [place] [facility] where she presented because of falls.Patient says that she was admitted [facility] few weeks ago due to acute gout,then went to a rehab center and returned home six days ago.She is followed by home health nurse twice a week.She lives alone and uses a cane to move around.  On the day of admission  patient fell while trying to walk to get her clothes.She felt dizzy before the fall but did not pass out.Home health nurse was present, called EMS.  A psychiatry consult was requested as patient refused to step down to SNF. Today patient reports she is aware that she may not be able to return home due to her medical issues and is willing to step down to SNF. She is alert and oriented to person & place although has lapse in memory. She understands living alone is not in her best interest and is willing to go with treatment team recommendations.     ??

## 2017-08-09 NOTE — Progress Notes (Signed)
Bedside shift change report given to Joyce GrossKay (Cabin crewoncoming nurse) by Armed forces operational officerAmber (offgoing nurse). Report included the following information SBAR, Kardex, Procedure Summary, Intake/Output, MAR and Recent Results.

## 2017-08-09 NOTE — Progress Notes (Signed)
Problem: Self Care Deficits Care Plan (Adult)  Goal: *Acute Goals and Plan of Care (Insert Text)  Occupational Therapy Goals  Initiated 08/06/2017   1.  Patient will completed toilet transfer with CG within 7 days.  2.  Patient will complete toileting with min A within 7 days.  3.  Patient will complete dressing from chair level with min A within 7 days.  4.  Patient will complete bathing from chair with min A within 7 days.  5.  Patient will complete grooming activities standing for 3 minutes within 7 days.   6.  Patient will complete light IADL activity with min A within 7 days.    Occupational Therapy TREATMENT  Patient: Jenny [name] ([age] y.o. female)  Date: 08/09/2017  Diagnosis: Falls  Frequent falls  Elevated troponin  Fall  Elevated troponin Falls      Precautions: Fall  Chart, occupational therapy assessment, plan of care, and goals were reviewed.    ASSESSMENT:  Patient cleared by RN to be seen for OT session, patient received in bed and with maximal encouragement, agreeable to treatment. Patient with supervision for bed mobility, CGA for functional mobility with RW. Patient ultimately needed total A for lower body dressing 2* self limited behavior and mildly decreased reach to feet. Patient required CGA to stand at sink and complete grooming/oral care. Patient returned to supine with supervision. Patient noted to have poor safety awareness, impaired balance and decreased functional activity tolerance. Patient not safe to return home alone, would benefit from SNF rehab once medically cleared for D/C.     Recommend with nursing patient to complete as able in order to maintain strength, endurance and independence: ADLs with supervision/setup, OOB to chair 3x/day and mobilizing to the bathroom for toileting with 1 assist (with RW and gait belt). Thank you for your assistance.     Progression toward goals:  []        Improving appropriately and progressing toward goals   [x]        Improving slowly and progressing toward goals  []        Not making progress toward goals and plan of care will be adjusted     PLAN:  Patient continues to benefit from skilled intervention to address the above impairments.  Continue treatment per established plan of care.  Discharge Recommendations:  Skilled Nursing Facility  Further Equipment Recommendations for Discharge:  TBD by rehab     SUBJECTIVE:   Patient stated ???If [facility] won't take me, I need to call them to change their mind.???    OBJECTIVE DATA SUMMARY:   Cognitive/Behavioral Status:  Neurologic State: Alert  Orientation Level: Oriented to person;Oriented to place;Oriented to time;Disoriented to situation  Cognition: Appropriate for age attention/concentration;Decreased command following;Impaired decision making;Memory loss;Impulsive;Poor safety awareness  Perception: Appears intact  Perseveration: Perseverates during conversation(on needing to call [facility] )  Safety/Judgement: Awareness of environment;Decreased awareness of need for assistance;Decreased awareness of need for safety;Decreased insight into deficits;Fall prevention  Functional Mobility and Transfers for ADLs:Bed Mobility:  Supine to Sit: Supervision(with HOB elevated)  Sit to Supine: Supervision  Scooting: Supervision    Transfers:  Sit to Stand: Contact guard assistance    Balance:  Sitting: Intact  Standing: Impaired;With support  Standing - Static: Good  Standing - Dynamic : Fair  ADL Intervention:  Grooming  Washing Face: Contact guard assistance(standing at sink )  Brushing Teeth: Contact guard assistance(standing at sink )  Cues: Verbal cues provided    Lower Body Dressing  Assistance  Socks: Maximum assistance(patient attempted in tailor sitting, quickly gave up)  Leg Crossed Method Used: Yes(attempted with LLE)  Position Performed: Seated edge of bed  Cues: Don;Physical assistance    Cognitive Retraining   Safety/Judgement: Awareness of environment;Decreased awareness of need for assistance;Decreased awareness of need for safety;Decreased insight into deficits;Fall prevention  Pain:  Pain Scale 1: Numeric (0 - 10)  Pain Intensity 1: 3  Pain Location 1: Hip  Pain Orientation 1: Right  Pain Intervention(s) 1: Medication (see MAR)  Activity Tolerance:   Fair, VSS  Please refer to the flowsheet for vital signs taken during this treatment.  After treatment:   []  Patient left in no apparent distress sitting up in chair  [x]  Patient left in no apparent distress in bed  [x]  Call bell left within reach  [x]  Nursing notified  []  Caregiver present  []  Bed alarm activated    COMMUNICATION/COLLABORATION:   The patient???s plan of care was discussed with: Physical Therapist, Registered Nurse and Social Worker    Evalina Field, OT  Time Calculation: 15 mins

## 2017-08-09 NOTE — Progress Notes (Signed)
Bedside shift change report given to Debbie, RN (oncoming nurse) by Kehinde, RN (offgoing nurse). Report included the following information SBAR, Kardex, Intake/Output, MAR and Recent Results.

## 2017-08-09 NOTE — Consults (Signed)
Jenny [name] is a 84 y.o.  female with h/o gout,cardiac arrhythmia,valvular disease,pulm hypertension,hypothyroidism,gerd who is a transfer from [place] ED where she presented because of falls.Patient says that she was admitted to the [facility] few weeks ago due to acute gout,then went to a rehab center and returned home six days ago.She is followed by home health nurse twice a week.She lives alone and uses a cane to move around.  On the day of admission  patient fell while trying to walk to get her clothes.She felt dizzy before the fall but did not pass out.Home health nurse was present, called EMS.  A psychiatry consult was requested as patient refused to step down to SNF. Today patient reports she is aware that she may not be able to return home due to her medical issues and is willing to step down to SNF. She is alert and oriented to person & place although has lapse in memory. She understands living alone is not in her best interest and is willing to go with treatment team recommendations.     ??

## 2017-08-09 NOTE — Progress Notes (Signed)
Pharmacist Note ??? Warfarin Dosing  Consult provided for this 84 y.o.female to manage warfarin for Atrial Fibrillation    INR Goal: 2 - 3  Home regimen/ tablet size: 1 mg by mouth daily    Drugs that may increase INR:  None  Drugs that may decrease INR: None  Other current anticoagulants/ drugs that may increase bleeding risk: None  Risk factors: Age > 65  Daily INR ordered: YES    Recent Labs     08/09/17  0410 08/08/17  1600 08/07/17  0432   INR 1.3* 1.3* 1.5*     Date               INR                  Dose  2/01  2.7  1 mg   2/02  3.1  Held   2/03  2.3  Held   2/04  1.5  1 mg   2/05  1.3  2.5 mg  2/06  1.3  1 mg                                                                               Assessment/ Plan:  Will order warfarin 1 mg PO x 1 dose.  Unable to appreciate boost dose given yesterday.  Common for delay in INR response in the elderly.  Will proceed with conservative dose this evening.     Pharmacy will continue to monitor daily and adjust therapy as indicated.  Please contact the pharmacist at [phone] or [phone] for outpatient recommendations if needed.

## 2017-08-09 NOTE — Progress Notes (Signed)
Up to bedside commode independently-bed alarm replaced under pt -

## 2017-08-09 NOTE — Progress Notes (Signed)
Hospitalist Progress Note  Ernesta Amble, NP  Answering service: [phone] OR [phone] from in house phone  Cell: [phone]      Date of Service:  08/09/2017  NAME:  Jenny [name]  DOB:  29-Jul-1928  MRN:  [id number]    Admission Summary:   Ms. Mcqueary is a 84 yo female with PMH significant for acute gout for which she just was released from rehab for ~ 2 weeks ago, arrhythmia, valvular disease, pulmonary HTN, and GERD. Came to SPED s/p fall with no LOC and found to have elevated troponin and admitted for further work up.  ??  Interval history / Subjective:   Pt in bed, no new complaints - does not seem to understand or remember to understand that [facility] has declined her for SNF. She insists she has been speaking with [facility] admissions Interior and spatial designer.     Assessment & Plan:     Fall:  - She??indicated to a previous provider she was??not dizzy prior to the fall, so ? hx of this  - Has reason for falls given severe gout to feet, she only endorsed 1 fall since she was released from rehab ~2 weeks??PTA  - Denied LOC and no pain with negative head CT  - needs to go to SNF - not safe and would be high risk for falls at [facility] and readmissions  ??  Hx??cardiac arrhythmia:  - Pacer placed ~ 9 years ago and battery changed ~ 1 year ago  -??no tachyarrhythmia on pace maker interrogation per Dr. Loa Socks  ??  Elevated troponin (POA):  - Pt declined stress testing   -??trop flat  - discussed with Dr. Loa Socks (2/4 evening), could pursue stress test outpatient  ??  Severe gout:  -??needs PT/OT  - Continue Colchicine & low dose Prednisone on discharge  ??  CKD, stage 3:??Cr improved  ??  Hx??HTN:??now on 10 mg norvasc  - received a dose of hydralazine today ~0100  - will monitor  ??  Hx??Hypothyroid:??Continue Synthroid ->??TSH normal  ??  Chronic Afib:   - rate controlled  - pharmacy dosing coumadin, INR still 1.3    Abdominal Cramping:   - difficulty passing stool, increased colace to BID   - may have bentyl prn    Code status: Full  DVT prophylaxis: on coumadin  Care Plan discussed with: patient, case manager and nurse  Disposition: to be determined. Concerned about her ability to make rational and thought out decisions. Psychiatry has been consult to assess competency. Apparently her family has been concerned for some time. Pt is medically ready for discharge.     Hospital Problems  Date Reviewed: 2017-08-17          Codes Class Noted POA    Pulmonary hypertension (HCC) ICD-10-CM: I27.20  ICD-9-CM: 416.8  11/21/2011 Yes        Elevated troponin ICD-10-CM: R74.8  ICD-9-CM: 790.6  08/04/2017 Unknown        Gout ICD-10-CM: M10.9  ICD-9-CM: 274.9  08/04/2017 Unknown        CKD (chronic kidney disease) ICD-10-CM: N18.9  ICD-9-CM: 585.9  08/04/2017 Unknown        Heart valve disease ICD-10-CM: I38  ICD-9-CM: 424.90  08/04/2017 Unknown        Frequent falls ICD-10-CM: R29.6  ICD-9-CM: V15.88  08/04/2017 Unknown        Gait instability ICD-10-CM: R26.81  ICD-9-CM: 781.2  08/04/2017 Unknown        Anticoagulated on Coumadin ICD-10-CM: Z51.81, Z79.01  ICD-9-CM: V58.83, V58.61  08/04/2017 Unknown            Review of Systems:   Denies HA. No chest pain or pressure. No respiratory complaints. + stomach cramping and had difficulty passing stool today.    Vital Signs:    Last 24hrs VS reviewed since prior progress note. Most recent are:  Visit Vitals  BP 145/73 (BP 1 Location: Left arm, BP Patient Position: At rest)   Pulse 73   Temp 96 ??F (35.6 ??C)   Resp 16   Ht 5\' 6"  (1.676 m)   Wt 68.5 kg (151 lb)   SpO2 96%   BMI 24.37 kg/m??     No intake or output data in the 24 hours ending 08/09/17 1535     Physical Examination:       Constitutional: ??No acute distress.????   ENT: ??Oral MM moist, oropharynx benign. ??   Resp: ??No accessory muscle use and on RA   CV: ??Regular rhythm   ??Musculoskeletal: ??No edema, warm, 2+ pulses throughout. Malformation of feet (nontender)    ??Neurologic: ??Moves all extremities. ??AAOx3, CN II-XII reviewed. No focal deficits   Psych: Calm and cooperative. Fixated on [facility] administrator calling her despite efforts to tell her she is not able to go there.      Data Review:   Review and/or order of tests in the medicine section of CPT    Labs:   No results for input(s): WBC, HGB, HCT, PLT, HGBEXT, HCTEXT, PLTEXT, HGBEXT, HCTEXT, PLTEXT in the last 72 hours.  Recent Labs     08/07/17  0600   NA 139   K 4.2   CL 104   CO2 27   BUN 19   CREA 1.23*   GLU 96   CA 8.2*     No results for input(s): SGOT, GPT, ALT, AP, TBIL, TBILI, TP, ALB, GLOB, GGT, AML, LPSE in the last 72 hours.    No lab exists for component: AMYP, HLPSE  Recent Labs     08/09/17  0410 08/08/17  1600 08/07/17  0432   INR 1.3* 1.3* 1.5*   PTP 12.7* 12.7* 15.0*      No results for input(s): FE, TIBC, PSAT, FERR in the last 72 hours.   No results found for: FOL, RBCF   No results for input(s): PH, PCO2, PO2 in the last 72 hours.  No results for input(s): CPK, CKNDX, TROIQ in the last 72 hours.    No lab exists for component: CPKMB  Lab Results   Component Value Date/Time    Cholesterol, total 191 08/05/2017 01:45 AM    HDL Cholesterol 64 08/05/2017 01:45 AM    LDL, calculated 101.6 (H) 16/10/960402/08/2017 01:45 AM    Triglyceride 127 08/05/2017 01:45 AM    CHOL/HDL Ratio 3.0 08/05/2017 01:45 AM     Lab Results   Component Value Date/Time    Glucose (POC) 96 08/08/2017 04:31 PM    Glucose (POC) 112 (H) 08/07/2017 08:58 PM    Glucose (POC) 90 08/07/2017 05:01 PM     Lab Results   Component Value Date/Time    Color YELLOW/STRAW 08/04/2017 03:18 PM    Appearance CLEAR 08/04/2017 03:18 PM    Specific gravity 1.010 08/04/2017 03:18 PM    pH (UA) 5.5 08/04/2017 03:18 PM    Protein NEGATIVE  08/04/2017 03:18 PM    Glucose NEGATIVE  08/04/2017 03:18 PM    Ketone NEGATIVE  08/04/2017 03:18 PM  Bilirubin NEGATIVE  08/04/2017 03:18 PM    Urobilinogen 0.2 08/04/2017 03:18 PM     Nitrites NEGATIVE  08/04/2017 03:18 PM    Leukocyte Esterase NEGATIVE  08/04/2017 03:18 PM    Epithelial cells FEW 08/04/2017 03:18 PM    Bacteria NEGATIVE  08/04/2017 03:18 PM    WBC 0-4 08/04/2017 03:18 PM    RBC 0-5 08/04/2017 03:18 PM         Medications Reviewed:     Current Facility-Administered Medications   Medication Dose Route Frequency   ??? amLODIPine (NORVASC) tablet 10 mg  10 mg Oral DAILY   ??? acetaminophen (TYLENOL) tablet 650 mg  650 mg Oral Q4H PRN   ??? Warfarin - Pharmacy to Dose   Other Rx Dosing/Monitoring   ??? docusate sodium (COLACE) capsule 100 mg  100 mg Oral DAILY   ??? sodium chloride (NS) flush 5-40 mL  5-40 mL IntraVENous Q8H   ??? sodium chloride (NS) flush 5-40 mL  5-40 mL IntraVENous PRN   ??? colchicine tablet 0.6 mg  0.6 mg Oral DAILY   ??? DULoxetine (CYMBALTA) capsule 60 mg  60 mg Oral DAILY   ??? HYDROcodone-acetaminophen (NORCO) 5-325 mg per tablet 1 Tab  1 Tab Oral Q4H PRN   ??? levothyroxine (SYNTHROID) tablet 88 mcg  88 mcg Oral ACB   ??? predniSONE (DELTASONE) tablet 5 mg  5 mg Oral DAILY WITH BREAKFAST   ??? sodium chloride (NS) flush 5-40 mL  5-40 mL IntraVENous Q8H   ??? sodium chloride (NS) flush 5-40 mL  5-40 mL IntraVENous PRN   ??? ondansetron (ZOFRAN) injection 4 mg  4 mg IntraVENous Q4H PRN   ______________________________________________________________________  EXPECTED LENGTH OF STAY: 2d 19h  ACTUAL LENGTH OF STAY:          2              Ernesta Amble, NP

## 2017-08-09 NOTE — Progress Notes (Signed)
Problem: Mobility Impaired (Adult and Pediatric)  Goal: *Acute Goals and Plan of Care (Insert Text)  Physical Therapy Goals  Initiated 08/07/2017  1.  Patient will move from supine to sit and sit to supine  in bed with independence within 7 day(s).    2.  Patient will transfer from bed to chair and chair to bed with supervision using the least restrictive device within 7 day(s).  3.  Patient will perform sit to stand with supervision/set-up within 7 day(s).  4.  Patient will ambulate with supervision/set-up for 100 feet with the least restrictive device within 7 day(s).      physical Therapy TREATMENT  Patient: Jenny [name] (84 y.o. female)  Date: 08/09/2017  Diagnosis: Falls  Frequent falls  Elevated troponin  Fall  Elevated troponin Falls      Precautions: Fall  Chart, physical therapy assessment, plan of care and goals were reviewed.    ASSESSMENT:  Patient on phone with rehab center upon arrival, agreeable to PT session initally but subsequently required near constant redirection to focus on mobility tasks and goals of session.  Overall requiring CGA to MIN A for safety with transfers and gait with RW only around bed to chair with serious safety concerns noted with RW usage.  Resistant to following commands and once in chair patient immediately wanted to go back to bed, c/o nausea and feeling lightheaded (vitals obtained, SpO2 95% on RA, BP 170/69, HR 68).  Noted tremors and high anxiety.  Education provided regarding benefits of getting OOB, detriments of bed rest, need to demo working with therapy for rehab placement, but patient unwilling to stay in chair more than 5-6 minutes.  Assisted back to bed where patient immediately appeared to calm.  Very self-limiting behavior noted throughout session (won't attempt to don/doff socks or move her own sheet).  Cont to recommend SNF rehab at d/c as patient remains a HIGH risk for falls and would not be safe at home alone.    Progression toward goals:   []     Improving appropriately and progressing toward goals  [x]     Improving slowly and progressing toward goals  []     Not making progress toward goals and plan of care will be adjusted     PLAN:  Patient continues to benefit from skilled intervention to address the above impairments.  Continue treatment per established plan of care.  Discharge Recommendations:  Skilled Nursing Facility  Further Equipment Recommendations for Discharge:  TBD     SUBJECTIVE:   Patient stated ???I just have had many surgeries and rehab, I ONLY WANT TO GO BACK TO [facility].???    OBJECTIVE DATA SUMMARY:   Critical Behavior:  Neurologic State: Alert  Orientation Level: Oriented to person, Oriented to place, Oriented to time, Disoriented to situation  Cognition: Appropriate for age attention/concentration, Decreased command following, Impaired decision making, Memory loss, Impulsive, Poor safety awareness  Safety/Judgement: Awareness of environment, Decreased awareness of need for assistance, Decreased awareness of need for safety, Decreased insight into deficits, Fall prevention  Functional Mobility Training:  Bed Mobility:     Supine to Sit: Supervision(with HOB elevated)  Sit to Supine: Supervision  Scooting: Supervision        Transfers:  Sit to Stand: Contact guard assistance  Stand to Sit: Contact guard assistance        Bed to Chair: Contact guard assistance  Balance:  Sitting: Intact  Standing: Impaired;With support  Standing - Static: Good  Standing - Dynamic : FairAmbulation/Gait Training:  Distance (ft): 12 Feet (ft)  Assistive Device: Gait belt;Walker, rolling  Ambulation - Level of Assistance: Contact guard assistance;Minimal assistance(MIN A to prevent RW from advancing too far)        Gait Abnormalities: Decreased step clearance(flexed trunk)              Speed/Cadence: Slow;Shuffled  Step Length: Right shortened;Left shortened                    Pain:  Pain Scale 1: Numeric (0 - 10)  Pain Intensity 1: 4   Pain Location 1: Hip  Pain Orientation 1: Right  Pain Description 1: Aching  Pain Intervention(s) 1: Rest  Activity Tolerance:   Low - bed to chair only today    Please refer to the flowsheet for vital signs taken during this treatment.  After treatment:   []     Patient left in no apparent distress sitting up in chair  [x]     Patient left in no apparent distress in bed  [x]     Call bell left within reach  [x]     Nursing notified  []     Caregiver present  []     Bed alarm activated    COMMUNICATION/COLLABORATION:   The patient???s plan of care was discussed with: Registered Nurse    Kelvin CellarAmanda J Gallagher, PT   Time Calculation: 31 mins

## 2017-08-09 NOTE — Progress Notes (Addendum)
Had multiple conversations with patient yesterday regarding her need for assistance at discharge and recommendation for rehab. Tried to talk to patient about the fact that she is not safe to live alone. Patient"s conversation went circular around wanting to go home for 1 day to see her dog and then [facility]  This writer would leave patient to consider her options and return an hour later only to find patient did not remember our previous conversation. Spoke with patient's son, Leonette MostCharles who expressed frustration with patient. Son stated patient does not recognize her limitations and when she goes home is completely dependent for all her care.  Patient has a LTC policy but refuses any personal care or assisted living. Son stated patient will also threaten suicide frequently and son [facility]. Currently patient is refusing rehab placement and does not acknowledge that her son is refusing to take patient home as he is unable to care for her. Per son an APS referral was placed just prior to [facility]'s [facility]. APS did not have the opportunity to evaluate patient but did recommend a competency evaluation. Discussed case with NP and attending and a consult has been placed to psych this AM for competency assessment.     [phone]

## 2017-08-10 LAB — PROTHROMBIN TIME + INR
INR: 1.2 — ABNORMAL HIGH (ref 0.9–1.1)
Prothrombin time: 12.4 s — ABNORMAL HIGH (ref 9.0–11.1)

## 2017-08-10 MED ORDER — DICYCLOMINE 10 MG CAP
10 mg | ORAL_CAPSULE | Freq: Three times a day (TID) | ORAL | 0 refills | Status: AC | PRN
Start: 2017-08-10 — End: ?

## 2017-08-10 MED ORDER — DOCUSATE SODIUM 100 MG CAP
100 mg | ORAL_CAPSULE | Freq: Two times a day (BID) | ORAL | 0 refills | Status: AC
Start: 2017-08-10 — End: ?

## 2017-08-10 MED ORDER — ACETAMINOPHEN 325 MG TABLET
325 mg | ORAL_TABLET | ORAL | 0 refills | Status: AC | PRN
Start: 2017-08-10 — End: ?

## 2017-08-10 MED ORDER — WARFARIN 1 MG TAB
1 mg | Freq: Once | ORAL | Status: AC
Start: 2017-08-10 — End: 2017-08-11
  Administered 2017-08-11: 06:00:00 via ORAL

## 2017-08-10 MED ORDER — AMLODIPINE 10 MG TAB
10 mg | ORAL_TABLET | Freq: Every day | ORAL | 0 refills | Status: AC
Start: 2017-08-10 — End: ?

## 2017-08-10 MED ORDER — WARFARIN 2.5 MG TAB
2.5 mg | ORAL_TABLET | Freq: Every day | ORAL | 0 refills | Status: DC
Start: 2017-08-10 — End: 2017-08-12

## 2017-08-10 MED FILL — NORMAL SALINE FLUSH 0.9 % INJECTION SYRINGE: INTRAMUSCULAR | Qty: 10

## 2017-08-10 MED FILL — HYDROCODONE-ACETAMINOPHEN 5 MG-325 MG TAB: 5-325 mg | ORAL | Qty: 1

## 2017-08-10 MED FILL — DOK 100 MG CAPSULE: 100 mg | ORAL | Qty: 1

## 2017-08-10 MED FILL — LEVOTHYROXINE 88 MCG TAB: 88 mcg | ORAL | Qty: 1

## 2017-08-10 MED FILL — JANTOVEN 1 MG TABLET: 1 mg | ORAL | Qty: 1

## 2017-08-10 MED FILL — PREDNISONE 5 MG TAB: 5 mg | ORAL | Qty: 1

## 2017-08-10 MED FILL — COLCHICINE 0.6 MG TAB: 0.6 mg | ORAL | Qty: 1

## 2017-08-10 MED FILL — WARFARIN 1 MG TAB: 1 mg | ORAL | Qty: 1

## 2017-08-10 MED FILL — DULOXETINE 60 MG CAP, DELAYED RELEASE: 60 mg | ORAL | Qty: 1

## 2017-08-10 MED FILL — AMLODIPINE 5 MG TAB: 5 mg | ORAL | Qty: 2

## 2017-08-10 NOTE — Discharge Summary (Signed)
Discharge Summary by Ernesta Amble, NP at 08/10/17 [phone]                Author: Ernesta Amble, NP  Service: Nurse Practitioner  Author Type: Nurse Practitioner       Filed: 08/10/17 1011  Date of Service: 08/10/17 0944  Status: Attested           Editor: Ernesta Amble, NP (Nurse Practitioner)  Cosigner: Rulon Abide, MD at 08/11/17 1136          Attestation signed by Rulon Abide, MD at 08/11/17 1136          I have personally performed a diagnostic face to face encounter with patient on the date of service. I have reviewed the chart in detail, including notes, labs  and images and discussed the case with Norman Clay NP and agree with the documented findings and discharge plan.                                            Discharge Summary        PATIENT ID: Jenny [name]   MRN: [id number]     DATE OF BIRTH: March 22, 1929      DATE OF ADMISSION: 08/04/2017  1:38 PM      DATE OF DISCHARGE:  08/10/2017   PRIMARY CARE PROVIDER:  Maurine Simmering, MD    ATTENDING PHYSICIAN: Reche Dixon, MD   DISCHARGING PROVIDER:  Ernesta Amble, NP. To contact this individual call [phone] and ask the operator to page.  If unavailable ask to be transferred the  Adult Hospitalist Department.      CONSULTATIONS: ED CONSULT TO SENIOR  SERVICES CASE MANAGEMENT   IP CONSULT TO PSYCHIATRY   IP CONSULT TO CARDIOLOGY      ADMITTING DIAGNOSES & HOSPITAL  COURSE:    Jenny [name] is a 84 yo female with PMH significant for acute gout for which she just was released from rehab for ~ 2 weeks ago, arrhythmia, valvular  disease, pulmonary HTN, and GERD. Came to SPED s/p fall with no LOC and found to have elevated troponin and admitted for further work up.        DISCHARGE DIAGNOSES / P LAN:           Fall:   - She??indicated to a previous provider she was??not dizzy prior to the fall, so ???hx??of this   - Has reason for falls given severe gout to feet,??she only endorsed 1 fall since she was released from rehab ~2 weeks??PTA   - Denied LOC  and no pain with negative head CT   - plans for SNF today ([facility])   ??   Hx??cardiac arrhythmia:   - Pacer placed ~ 9 years ago and battery changed ~ 1 year ago   -??no tachyarrhythmia on pace maker interrogation per Dr. Loa Socks   ??   Elevated troponin (POA):   -??Pt declined stress testing    -??trop flat   - discussed with Dr. Loa Socks (2/4 evening), could pursue stress test outpatient   ??   Severe gout:   -??PT/OT   - Continue Colchicine & low dose Prednisone??on discharge   ??   CKD, stage 3:??Cr??improved   ??   Hx??HTN:??now on 10 mg norvasc   - check daily BPs - may need additional agent   ??   Hx??Hypothyroid:??Continue Synthroid ->??  TSH normal   ??   Chronic Afib:    - rate controlled   - pharmacy dosing coumadin, INR now 1.2. Discussed with pharmacy, discharge on 2.5 mg daily.    - monitor INR at SNF at least every other day and adjust coumadin as indicated   ??   Abdominal Cramping:    - difficulty passing stool 2/6, increased colace to BID   - may have bentyl prn        FOLLOW UP APPOINTMENTS:       Follow-up Information               Follow up With  Specialties  Details  Why  Contact Info              Maurine Simmering, MD  Internal Medicine  In 1 week  or facility provider  [address]   [address]   [place]  [place] [address]   [phone]                 Waynetta Sandy, MD  Cardiology  In 2 weeks  consider for outpatient stress testing   [address] [facility]   [address]   [place] [address]   [phone]                 Juliet Rude)  Skilled Nursing Facility      [address] [place] [address]   [phone]                 ADDITIONAL CARE RECOMMENDATIONS:    - check BP daily. Norvasc was increased to 10 mg daily this admit but an additional medication may need to be added (avoiding ACEi/ARB due to CKD)      - pt can be manipulative at times - please be aware      Normally on 1 mg daily at home (this was her PTA dose, pts INR was 2.7/3.1 near admit)   2/5: was given 2.5 mg coumadin    2/6: was given 1 mg coumadin with expectation of INR to bump up      Plan: discharge with 2.5 mg daily. Please monitor INR every other day and adjust coumadin as needed      DIET: Cardiac      ACTIVITY: PT/OT Eval and Treat      DISCHARGE MEDICATIONS:     Current Discharge Medication List              START taking these medications          Details        dicyclomine (BENTYL) 10 mg capsule  Take 1 Cap by mouth three (3) times daily as needed.   Qty: 20 Cap, Refills:  0               docusate sodium (COLACE) 100 mg capsule  Take 1 Cap by mouth two (2) times a day.   Qty: 60 Cap, Refills:  0               acetaminophen (TYLENOL) 325 mg tablet  Take 2 Tabs by mouth every four (4) hours as needed.   Qty: 30 Tab, Refills:  0                     CONTINUE these medications which have CHANGED          Details  amLODIPine (NORVASC) 10 mg tablet  Take 1 Tab by mouth daily.   Qty: 30 Tab, Refills:  0               warfarin (COUMADIN) 2.5 mg tablet  Take 1 Tab by mouth daily.   Qty: 20 Tab, Refills:  0                     CONTINUE these medications which have NOT CHANGED          Details        predniSONE (DELTASONE) 5 mg tablet  Take 5 mg by mouth.               colchicine (COLCRYS) 0.6 mg tablet  Take 0.6 mg by mouth daily.               diclofenac (VOLTAREN) 1 % gel  Apply 1 g to affected area.               DULoxetine (CYMBALTA) 60 mg capsule  Take 60 mg by mouth.               levothyroxine (SYNTHROID) 88 mcg tablet  Take 88 mcg by mouth.                     STOP taking these medications                  AMLODIPINE BESYLATE, BULK,  Comments:    Reason for Stopping:                      azithromycin (ZITHROMAX) 250 mg tablet  Comments:    Reason for Stopping:                      cefUROXime (CEFTIN) 500 mg tablet  Comments:    Reason for Stopping:                      HYDROcodone-acetaminophen (NORCO) 5-325 mg per tablet  Comments:    Reason for Stopping:                          NOTIFY YOUR PHYSICIAN FOR ANY OF THE  FOLLOWING:     Fever over 101 degrees for 24 hours.    Chest pain, shortness of breath, fever, chills, nausea, vomiting, diarrhea, change in mentation, falling, weakness, bleeding. Severe pain or pain not relieved by medications.   Or, any other signs or symptoms that you may have questions about.      DISPOSITION:         Home With:     OT    PT    HH    RN                 xx  Long term SNF /Inpatient Rehab       Independent/assisted living          Hospice          Other:        PATIENT CONDITION AT DISCHARGE:    Functional status         Poor      xx  Deconditioned           Independent         Cognition          Lucid  xx  Forgetful           Dementia         Catheters/lines (plus indication)         Foley        PICC        PEG         xx  None         Code status        xx  Full code           DNR         PHYSICAL EXAMINATION AT DISCHARGE:   BP 163/73    Pulse 70    Temp 98.1 ??F (36.7 ??C)    Resp 16    Ht 5\' 6"  (1.676 m)    Wt 68.5 kg (151 lb)    SpO2 95%    BMI 24.37 kg/m??       Pt in bed, no complaints of pain, SOB this morning. Case manager concerns about patients mental status as she was being very repetitive and talking oddly (was talking about Beverly SessionsJohn Lennon). Upon entering room, pt recognized me and was courteous and conversant.  She was A/O x 3 and non argumentative. She does have a hard time remembering facility names and constantly calls [facility] (where she did rehab several weeks ago[facility]. She also [facility]. She is agreeable to going  to SNF and [facility]. Psych has seen and pt can make her own decisions.          Constitutional:  ??No acute distress.????     ENT:  ??Oral??MM??moist. ??     Resp:  ??No accessory muscle use??and on RA     CV:  ??Regular rhythm        ??Musculoskeletal:  ??No edema, warm, 2+ pulses throughout. Malformation of feet (nontender)        ??Neurologic:  ??Moves all extremities with good upper and lower extremity  strenghth. Follows all  commands. Speech clear. ??AAOx3, CN II-XII reviewed. No focal deficits     Psych:  Calm and cooperative. Is forgetful and emotional at times.         CHRONIC MEDICAL DIAGNOSES:      Problem List  as of 08/10/2017  Date Reviewed:  08/10/2017                        Codes  Class  Noted - Resolved             Pulmonary hypertension (HCC)  ICD-10-CM: I27.20   ICD-9-CM: 416.8    11/21/2011 - Present                       Elevated troponin  ICD-10-CM: R74.8   ICD-9-CM: 790.6    08/04/2017 - Present                       Gout  ICD-10-CM: M10.9   ICD-9-CM: 274.9    08/04/2017 - Present                       CKD (chronic kidney disease)  ICD-10-CM: N18.9   ICD-9-CM: 585.9    08/04/2017 - Present                       Heart valve disease  ICD-10-CM: I38  ICD-9-CM: 424.90    08/04/2017 - Present                       Frequent falls  ICD-10-CM: R29.6   ICD-9-CM: V15.88    08/04/2017 - Present                       Gait instability  ICD-10-CM: R26.81   ICD-9-CM: 781.2    08/04/2017 - Present                       Anticoagulated on Coumadin  ICD-10-CM: Z51.81, Z79.01   ICD-9-CM: V58.83, V58.61    08/04/2017 - Present                       RESOLVED: Fall  ICD-10-CM: W19.Lorne Skeens   ICD-9-CM: E888.9    08/07/2017 - 08/07/2017                       * (Principal) RESOLVED: Falls  ICD-10-CM: Z61.Lorne Skeens   ICD-9-CM: W960.4    08/04/2017 - 08/07/2017                       Greater than 30 minutes were spent with the patient on counseling and coordination of care      Signed:    Ernesta Amble, NP   08/10/2017   9:44 AM

## 2017-08-10 NOTE — Consults (Signed)
Jenny [name]??is a 84 y.o.????female??with h/o gout,cardiac arrhythmia,valvular disease,pulm hypertension,hypothyroidism and gerd who is a transfer from [place] [facility] where she presented because of falls.Patient says that she was admitted to [facility] few weeks ago due to acute gout,then went to a rehab center and returned home six days ago.She is followed by home health nurse twice a week.She lives alone and uses a cane to move around.  On the day of admission  patient fell while trying to walk to get her clothes.She felt dizzy before the fall but did not pass out.Home health nurse was present, called EMS.  A psychiatry consult was requested as patient refused to step down to SNF. On 08/09/17 patient reports she was aware that she may not be able to return home due to her medical issues and was willing to step down to SNF. She was alert and oriented to person & place although has lapse in memory. She understood living alone is not in her best interest and was willing to go with treatment team recommendations.     Psychiatry was re-consulted again today due to change in mental status. When I went in to assess the patient she was agitated loud and labile. She yelled at me stating that she did not need to see a psychiatrist and that she is not "crazy". Staff report she had presented confused and paranoid believing people are out to ger her. This is a clear change in presentation compared to last evening.   Recommend Haldol 2 mg po BID.   May need to go the route of medical TDO if patient refuses PO.   Will follow up.   ??

## 2017-08-10 NOTE — Progress Notes (Signed)
Problem: Mobility Impaired (Adult and Pediatric)  Goal: *Acute Goals and Plan of Care (Insert Text)  Physical Therapy Goals  Initiated 08/07/2017  1.  Patient will move from supine to sit and sit to supine  in bed with independence within 7 day(s).    2.  Patient will transfer from bed to chair and chair to bed with supervision using the least restrictive device within 7 day(s).  3.  Patient will perform sit to stand with supervision/set-up within 7 day(s).  4.  Patient will ambulate with supervision/set-up for 100 feet with the least restrictive device within 7 day(s).      Outcome: Progressing Towards Goal  physical Therapy TREATMENT  Patient: Jenny [name] ([age] y.o. female)  Date: 08/10/2017  Diagnosis: Falls  Frequent falls  Elevated troponin  Fall  Elevated troponin Falls      Precautions: Fall  Chart, physical therapy assessment, plan of care and goals were reviewed.    ASSESSMENT:  The patient presents with Supervision functional transfers, Contact guard assistance gait x12 feet with rolling walker device. The following are barriers to independence while in acute care:   -Cognitive and/or behavioral: orientation and safety awareness, pt having hallucinations   -Medical condition: strength and standing balance    -Other:   History of falls    Prior level of function:  Lives alone. [facility]. Owns RW and Rollator     PLAN:  Patient continues to benefit from skilled intervention to address the above impairments.  Continue treatment per established plan of care.  Recommendations and/or planned interventions for staff:  If pt to be OOB to chair w/ NSG assist, recommend chair alarm.     Discharge recommendations: Rehab at skilled nursing facility (SNF): patient is working towards tolerating 3 hours of therapy   Patient's barriers to discharging home, in addition to above impairments: lives alone level of physical assist required to maintain patient safety.     Equipment recommendations for successful discharge (if) home: TBD     SUBJECTIVE:   Patient stated "Jenny [name] is giving me stomach cramps.??? Pt reports "Jenny [name]" who has been in pt's room has been giving her stomach cramps and has been blocking her son's phone, and pleading w/ therapist to not allow "Jenny [name]" to block the phone. Pt becoming agitated and upset; encouraged deep breathing while holding therapist hand. RN reported pt has been experiencing hallucinations during the day.     OBJECTIVE DATA SUMMARY:   Critical Behavior:  Neurologic State: Alert  Orientation Level: Oriented to person, Oriented to place, Oriented to time  Cognition: Impaired decision making  Safety/Judgement: Awareness of environment, Decreased awareness of need for assistance, Decreased awareness of need for safety, Decreased insight into deficits, Fall prevention  Functional Mobility Training:  Bed Mobility:     Supine to Sit: Supervision  Sit to Supine: Supervision           Transfers:  Sit to Stand: Contact guard assistance  Stand to Sit: Contact guard assistance                             Balance:  Sitting: Intact  Standing: Impaired;With support  Standing - Static: Good  Standing - Dynamic : FairAmbulation/Gait Training:  Distance (ft): 12 Feet (ft)  Assistive Device: Gait belt;Walker, rolling  Ambulation - Level of Assistance: Contact guard assistance        Gait Abnormalities: Decreased step clearance  Speed/Cadence: Shuffled;Slow  Step Length: Left shortened;Right shortened                           Pain:  Location: Abdomen   Description:cramping   Aggravating factors: experiences at rest and during mobility     Activity Tolerance:   Fair+  Please refer to the flowsheet for vital signs taken during this treatment.    After treatment patient left:   Supine in bed  Bed alarm/tab alert on  Call light within reach  RN notified  Side rails x 3     COMMUNICATION/COLLABORATION:    The patient???s plan of care was discussed with: Registered Nurse    Tamera Punt A Means,PTA   Time Calculation: 19 mins

## 2017-08-10 NOTE — Undefined (Signed)
Sleep disorders:  Patient stated at 2:30 pm that she was tired.     Problems with eating or feeding: Taking gingerale and water.  Refusing food.  Stating "The food has been tampered with"    Incontinence: UP to BSC x 2 with nurse. Fearful of falling.  Patient does remember falling and that is [facility].  BM moderate  Amt once and liquid BM second time.     Confusion:  Extremely paranoid.  Patient knows her name and [facility].  Thinking her roommate last night was "taken off to space"  Fearful, clutching her cell phone and room phone.  Thinks staff is communicating with each other through the computers and phones.      Evidence of Falls: Schmid score 5.  Bed alarm on.  PT in to work with patient.      Skin breakdown: Braden score 17.     Plan:Provide safety,  Reassure, calm..  Recommend just tylenol for pain not norco.  Nurse Director speaking with family member on the phone currently. Provide quiet room.  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  Remove computers on wheels from room.   Coralie Carpenonna Spainhour RN, MSN, CNS-BC, Gerontology  [phone]

## 2017-08-10 NOTE — Progress Notes (Signed)
Bedside shift change report given to Linda (oncoming nurse) by Sharlene (offgoing nurse). Report included the following information SBAR.

## 2017-08-10 NOTE — Discharge Summary (Signed)
Discharge Summary     PATIENT ID: Jenny [name]  MRN: [id number]   DATE OF BIRTH: 11/04/1928    DATE OF ADMISSION: 08/04/2017  1:38 PM    DATE OF DISCHARGE: 08/10/2017  PRIMARY CARE PROVIDER: Maurine Simmering, MD   ATTENDING PHYSICIAN: Reche Dixon, MD  DISCHARGING PROVIDER: Ernesta Amble, NP. To contact this individual call [phone] and ask the operator to page.  If unavailable ask to be transferred the Adult Hospitalist Department.    CONSULTATIONS: ED CONSULT TO SENIOR SERVICES CASE MANAGEMENT  IP CONSULT TO PSYCHIATRY  IP CONSULT TO CARDIOLOGY    ADMITTING DIAGNOSES & HOSPITAL COURSE:   [name] is a 84 yo female with PMH significant for acute gout for which she just was released from rehab for ~ 2 weeks ago, arrhythmia, valvular disease, pulmonary HTN, and GERD. Came to [facility] s/p fall with no LOC and found to have elevated troponin and admitted for further work up.    DISCHARGE DIAGNOSES / PLAN:      Fall:  - She??indicated to a previous provider she was??not dizzy prior to the fall, so ???hx??of this  - Has reason for falls given severe gout to feet,??she only endorsed 1 fall since she was released from rehab ~2 weeks??PTA  - Denied LOC and no pain with negative head CT  - plans for SNF today ([facility])  ??  Hx??cardiac arrhythmia:  - Pacer placed ~ 9 years ago and battery changed ~ 1 year ago  -??no tachyarrhythmia on pace maker interrogation per Dr. Loa Socks  ??  Elevated troponin (POA):  -??Pt declined stress testing   -??trop flat  - discussed with Dr. Loa Socks (2/4 evening), could pursue stress test outpatient  ??  Severe gout:  -??PT/OT  - Continue Colchicine & low dose Prednisone??on discharge  ??  CKD, stage 3:??Cr??improved  ??  Hx??HTN:??now on 10 mg norvasc  - check daily BPs - may need additional agent  ??  Hx??Hypothyroid:??Continue Synthroid ->??TSH normal  ??  Chronic Afib:   - rate controlled  - pharmacy dosing coumadin, INR now 1.2. Discussed with pharmacy, discharge on 2.5 mg daily.    - monitor INR at SNF at least every other day and adjust coumadin as indicated  ??  Abdominal Cramping:   - difficulty passing stool 2/6, increased colace to BID  - may have bentyl prn     FOLLOW UP APPOINTMENTS:    Follow-up Information     Follow up With Specialties Details Why Contact Info    Maurine Simmering, MD Internal Medicine In 1 week or facility provider [address]  [address]  [place]  [place] [address]  [phone]      Waynetta Sandy, MD Cardiology In 2 weeks consider for outpatient stress testing  [address] [facility]  [address]  [place] [address]  [phone]      Juliet Rude) Skilled Nursing Facility   [address] [place] [address]  [phone]           ADDITIONAL CARE RECOMMENDATIONS:   - check BP daily. Norvasc was increased to 10 mg daily this admit but an additional medication may need to be added (avoiding ACEi/ARB due to CKD)    - pt can be manipulative at times - please be aware    Normally on 1 mg daily at home (this was her PTA dose, pts INR was 2.7/3.1 near admit)  2/5: was given 2.5 mg coumadin  2/6:  was given 1 mg coumadin with expectation of INR to bump up    Plan: discharge with 2.5 mg daily. Please monitor INR every other day and adjust coumadin as needed    DIET: Cardiac    ACTIVITY: PT/OT Eval and Treat    DISCHARGE MEDICATIONS:  Current Discharge Medication List      START taking these medications    Details   dicyclomine (BENTYL) 10 mg capsule Take 1 Cap by mouth three (3) times daily as needed.  Qty: 20 Cap, Refills: 0      docusate sodium (COLACE) 100 mg capsule Take 1 Cap by mouth two (2) times a day.  Qty: 60 Cap, Refills: 0      acetaminophen (TYLENOL) 325 mg tablet Take 2 Tabs by mouth every four (4) hours as needed.  Qty: 30 Tab, Refills: 0         CONTINUE these medications which have CHANGED    Details   amLODIPine (NORVASC) 10 mg tablet Take 1 Tab by mouth daily.  Qty: 30 Tab, Refills: 0       warfarin (COUMADIN) 2.5 mg tablet Take 1 Tab by mouth daily.  Qty: 20 Tab, Refills: 0         CONTINUE these medications which have NOT CHANGED    Details   predniSONE (DELTASONE) 5 mg tablet Take 5 mg by mouth.      colchicine (COLCRYS) 0.6 mg tablet Take 0.6 mg by mouth daily.      diclofenac (VOLTAREN) 1 % gel Apply 1 g to affected area.      DULoxetine (CYMBALTA) 60 mg capsule Take 60 mg by mouth.      levothyroxine (SYNTHROID) 88 mcg tablet Take 88 mcg by mouth.         STOP taking these medications       AMLODIPINE BESYLATE, BULK, Comments:   Reason for Stopping:         azithromycin (ZITHROMAX) 250 mg tablet Comments:   Reason for Stopping:         cefUROXime (CEFTIN) 500 mg tablet Comments:   Reason for Stopping:         HYDROcodone-acetaminophen (NORCO) 5-325 mg per tablet Comments:   Reason for Stopping:             NOTIFY YOUR PHYSICIAN FOR ANY OF THE FOLLOWING:   Fever over 101 degrees for 24 hours.   Chest pain, shortness of breath, fever, chills, nausea, vomiting, diarrhea, change in mentation, falling, weakness, bleeding. Severe pain or pain not relieved by medications.  Or, any other signs or symptoms that you may have questions about.    DISPOSITION:    Home With:   OT  PT  HH  RN      xx Long term SNF/Inpatient Rehab    Independent/assisted living    Hospice    Other:     PATIENT CONDITION AT DISCHARGE:   Functional status    Poor    xx Deconditioned     Independent      Cognition     Lucid    xx Forgetful     Dementia      Catheters/lines (plus indication)    Foley     PICC     PEG    xx None      Code status    xx Full code     DNR      PHYSICAL EXAMINATION AT DISCHARGE:  BP 163/73  Pulse 70    Temp 98.1 ??F (36.7 ??C)    Resp 16    Ht 5\' 6"  (1.676 m)    Wt 68.5 kg (151 lb)    SpO2 95%    BMI 24.37 kg/m??     Pt in bed, no complaints of pain, SOB this morning. Case manager concerns about patients mental status as she was being very repetitive and talking  oddly (was talking about Beverly SessionsJohn Lennon). Upon entering room, pt recognized me and was courteous and conversant. She was A/O x 3 and non argumentative. She does have a hard time remembering facility names and constantly calls [facility] (where she did rehab several weeks ago) [facility]. She also [facility]. She is agreeable to going to SNF and okay [facility]. Psych has seen and pt can make her own decisions.     Constitutional: ??No acute distress.????   ENT: ??Oral??MM??moist. ??   Resp: ??No accessory muscle use??and on RA   CV: ??Regular rhythm   ??Musculoskeletal: ??No edema, warm, 2+ pulses throughout. Malformation of feet (nontender)   ??Neurologic: ??Moves all extremities with good upper and lower extremity strenghth. Follows all commands. Speech clear. ??AAOx3, CN II-XII reviewed. No focal deficits   Psych: Calm and cooperative. Is forgetful and emotional at times.      CHRONIC MEDICAL DIAGNOSES:  Problem List as of 08/10/2017 Date Reviewed: 08/10/2017          Codes Class Noted - Resolved    Pulmonary hypertension (HCC) ICD-10-CM: I27.20  ICD-9-CM: 416.8  11/21/2011 - Present        Elevated troponin ICD-10-CM: R74.8  ICD-9-CM: 790.6  08/04/2017 - Present        Gout ICD-10-CM: M10.9  ICD-9-CM: 274.9  08/04/2017 - Present        CKD (chronic kidney disease) ICD-10-CM: N18.9  ICD-9-CM: 585.9  08/04/2017 - Present        Heart valve disease ICD-10-CM: I38  ICD-9-CM: 424.90  08/04/2017 - Present        Frequent falls ICD-10-CM: R29.6  ICD-9-CM: V15.88  08/04/2017 - Present        Gait instability ICD-10-CM: R26.81  ICD-9-CM: 781.2  08/04/2017 - Present        Anticoagulated on Coumadin ICD-10-CM: Z51.81, Z79.01  ICD-9-CM: V58.83, V58.61  08/04/2017 - Present        RESOLVED: Fall ICD-10-CM: W19.Lorne SkeensXXXA  ICD-9-CM: E888.9  08/07/2017 - 08/07/2017        * (Principal) RESOLVED: Falls ICD-10-CM: [id number].Lorne SkeensXXXA  ICD-9-C: W960.4: E888.9  08/04/2017 - 08/07/2017             Greater than 30 minutes were spent with the patient on counseling and coordination of care    Signed:   Ernesta AmbleHolly M Avonell Lenig, NP  08/10/2017  9:44 AM

## 2017-08-10 NOTE — Consults (Signed)
Vesna??is a 84 y.o.????female??with h/o gout,cardiac arrhythmia,valvular disease,pulm hypertension,hypothyroidism and gerd who is a transfer from [place] [facility] ED where she presented because of falls.Patient says that she was admitted to the [facility] few weeks ago due to acute gout,then went to a rehab center and returned home six days ago.She is followed by home health nurse twice a week.She lives alone and uses a cane to move around.  On the day of admission  patient fell while trying to walk to get her clothes.She felt dizzy before the fall but did not pass out.Home health nurse was present, called EMS.  A psychiatry consult was requested as patient refused to step down to SNF. On 08/09/17 patient reports she was aware that she may not be able to return home due to her medical issues and was willing to step down to SNF. She was alert and oriented to person & place although has lapse in memory. She understood living alone is not in her best interest and was willing to go with treatment team recommendations.     Psychiatry was re-consulted again today due to change in mental status. When I went in to assess the patient she was agitated loud and labile. She yelled at me stating that she did not need to see a psychiatrist and that she is not "crazy". Staff report she had presented confused and paranoid believing people are out to ger her. This is a clear change in presentation compared to last evening.   Recommend Haldol 2 mg po BID.   May need to go the route of medical TDO if patient refuses PO.   Will follow up.   ??

## 2017-08-10 NOTE — Progress Notes (Signed)
Pharmacist Note ??? Warfarin Dosing  Consult provided for this 84 y.o.female to manage warfarin for Atrial Fibrillation    INR Goal: 2 - 3  Home regimen/ tablet size: 1 mg by mouth daily    Drugs that may increase INR:  None  Drugs that may decrease INR: None  Other current anticoagulants/ drugs that may increase bleeding risk: None  Risk factors: Age > 65  Daily INR ordered: YES    Recent Labs     08/10/17  0431 08/09/17  0410 08/08/17  1600   INR 1.2* 1.3* 1.3*     Date               INR                  Dose  2/01  2.7  1 mg   2/02  3.1  Held   2/03  2.3  Held   2/04  1.5  1 mg   2/05  1.3  2.5 mg  2/06  1.3  1 mg  2/07  1.2  2.5                                                                               Assessment/ Plan:  Will order warfarin 2.5 mg PO x 1 dose.      Pharmacy will continue to monitor daily and adjust therapy as indicated.  Please contact the pharmacist at x [phone] or [phone] for outpatient recommendations if needed.

## 2017-08-10 NOTE — Progress Notes (Signed)
This morning pt yelling out appears anxious. She is oriented to self, [place] and [place].  But seems confused about her situation. [facility]  Will continue to monitor.

## 2017-08-10 NOTE — Progress Notes (Signed)
Visited patient this AM regarding discharge plans. Psych had met with patient yesterday and found patient oriented to place and person and in agreement to rehab. This AM patient is talking but nothing makes since. Patient is unable to answer questions this AM and is saying random words such a "John Linan" and "blow it up". When patient was asked her name patient responded with "blow it up". Spoke with nursing this AM and have notified NP of the change in mental status.

## 2017-08-10 NOTE — Progress Notes (Signed)
Patient continues to yell out and appears confused. Notified NP and psych consulted as patient's behavior will make placement difficult.     [phone]

## 2017-08-11 LAB — PROTHROMBIN TIME + INR
INR: 1.4 — ABNORMAL HIGH (ref 0.9–1.1)
Prothrombin time: 13.8 s — ABNORMAL HIGH (ref 9.0–11.1)

## 2017-08-11 MED ORDER — WARFARIN 1 MG TAB
1 mg | Freq: Once | ORAL | Status: AC
Start: 2017-08-11 — End: 2017-08-11
  Administered 2017-08-11: 22:00:00 via ORAL

## 2017-08-11 MED ORDER — HALOPERIDOL 1 MG TAB
1 mg | Freq: Two times a day (BID) | ORAL | Status: DC
Start: 2017-08-11 — End: 2017-08-12
  Administered 2017-08-11 – 2017-08-12 (×4): via ORAL

## 2017-08-11 MED FILL — NORMAL SALINE FLUSH 0.9 % INJECTION SYRINGE: INTRAMUSCULAR | Qty: 10

## 2017-08-11 MED FILL — HALOPERIDOL 1 MG TAB: 1 mg | ORAL | Qty: 2

## 2017-08-11 MED FILL — WARFARIN 1 MG TAB: 1 mg | ORAL | Qty: 1

## 2017-08-11 MED FILL — ACETAMINOPHEN 325 MG TABLET: 325 mg | ORAL | Qty: 2

## 2017-08-11 MED FILL — DULOXETINE 60 MG CAP, DELAYED RELEASE: 60 mg | ORAL | Qty: 1

## 2017-08-11 MED FILL — AMLODIPINE 5 MG TAB: 5 mg | ORAL | Qty: 2

## 2017-08-11 MED FILL — COLCHICINE 0.6 MG TAB: 0.6 mg | ORAL | Qty: 1

## 2017-08-11 MED FILL — PREDNISONE 5 MG TAB: 5 mg | ORAL | Qty: 1

## 2017-08-11 MED FILL — DOK 100 MG CAPSULE: 100 mg | ORAL | Qty: 1

## 2017-08-11 MED FILL — LEVOTHYROXINE 88 MCG TAB: 88 mcg | ORAL | Qty: 1

## 2017-08-11 NOTE — Discharge Summary (Signed)
Discharge Summary by Ernesta Amble, NP at 08/11/17 [phone]                Author: Ernesta Amble, NP  Service: Nurse Practitioner  Author Type: Nurse Practitioner       Filed: 08/11/17 0921  Date of Service: 08/11/17 0912  Status: Attested           Editor: Ernesta Amble, NP (Nurse Practitioner)  Cosigner: Rulon Abide, MD at 08/11/17 1136          Attestation signed by Rulon Abide, MD at 08/11/17 1136          I have personally performed a diagnostic face to face encounter with patient on the date of service. I have reviewed the chart in detail, including notes, labs  and images and discussed the case with Jenny Clay NP and agree with the documented findings and discharge plan.                                            Discharge Summary        PATIENT ID: Jenny [name]   MRN: [id number]     DATE OF BIRTH: [birth year]      DATE OF ADMISSION: 08/04/2017  1:38 PM      DATE OF DISCHARGE:  08/11/2017   PRIMARY CARE PROVIDER:  Maurine Simmering, MD    ATTENDING PHYSICIAN: Reche Dixon, MD   DISCHARGING PROVIDER:  Ernesta Amble, NP. To contact this individual call [phone] and ask the operator to page.  If unavailable ask to be transferred the  Adult Hospitalist Department.      CONSULTATIONS: ED CONSULT TO SENIOR  SERVICES CASE MANAGEMENT   IP CONSULT TO PSYCHIATRY   IP CONSULT TO PSYCHIATRY   IP CONSULT TO CARDIOLOGY      ADMITTING DIAGNOSES & HOSPITAL  COURSE:    [name] is a 84 yo female with PMH significant for acute gout for which she just was released from rehab for ~ 2 weeks ago, arrhythmia, valvular  disease, pulmonary HTN, and GERD. Came to [facility] s/p fall with no LOC and found to have elevated troponin and admitted for further work up.        DISCHARGE DIAGNOSES / P LAN:           Fall:   - She??indicated to a previous provider she was??not dizzy prior to the fall, so ???hx??of this   - Has reason for falls given severe gout to feet,??she only endorsed 1 fall since she was released from rehab ~2  weeks??PTA   - Denied LOC and no pain with negative head CT   - plans for SNF today [facility])   ??   Hx??cardiac arrhythmia:   - Pacer placed ~ 9 years ago and battery changed ~ 1 year ago   -??no tachyarrhythmia on pace maker interrogation per Dr. Loa Socks   ??   Elevated troponin (POA):   -??Pt declined stress testing    -??trop flat   - discussed with Dr. Loa Socks (2/4 evening), could pursue stress test outpatient   ??   Severe gout:   -??PT/OT   - Continue Colchicine & low dose Prednisone??on discharge   ??   CKD, stage 3:??Cr??improved   ??   Hx??HTN:??now on 10 mg norvasc   - check daily BPs - may need additional agent   ??  Hx??Hypothyroid:??Continue Synthroid ->??TSH normal   ??   Chronic Afib:    - rate controlled   - pharmacy dosing coumadin, INR now 1.4. Discussed with pharmacy, discharge on 2.5 mg daily.    - monitor INR at SNF at least every other day and adjust coumadin as indicated   ??   Abdominal Cramping:    - difficulty passing stool 2/6, increased colace to BID   - may have bentyl prn      Paranoid Behavior:    - psychiatry saw yesterday and recommended Haldol to start, however did not order   - pt is calm and cooperative this morning and willing to take medication   - Discussed with Dr. Maxwell Marion in psychiatry: go ahead and start Haldol BID and will need outpatient psychiatry follow up.        FOLLOW UP APPOINTMENTS:       Follow-up Information               Follow up With  Specialties  Details  Why  Contact Info              Maurine Simmering, MD  Internal Medicine  In 1 week  or facility provider  [address]   [address]   [place]  [place] [address]   [phone]                 Waynetta Sandy, MD  Cardiology  In 2 weeks  consider for outpatient stress testing   [address] [facility]   [address]   [place] [address]   [phone]                 Psychiatrist    Schedule an appointment as soon as possible for a visit  Would like eval as soon as able please                ADDITIONAL CARE  RECOMMENDATIONS:    - check BP daily. Norvasc was increased to 10 mg daily this admit but an additional medication may need to be added (avoiding ACEi/ARB due to CKD)      - pt can be manipulative at times - please be aware      Normally on 1 mg coumadin daily at home (this was her PTA dose, pts INR was 2.7/3.1 near admit)   2/5: was given 2.5 mg coumadin   2/6: was given 1 mg coumadin with expectation of INR to bump up   2/7: given 2.5 mg coumadin      Plan: discharge with 2.5 mg daily. Please monitor INR every other day and adjust coumadin as needed      DIET: Cardiac      ACTIVITY: PT/OT Eval and Treat      DISCHARGE MEDICATIONS:     Current Discharge Medication List              START taking these medications          Details        dicyclomine (BENTYL) 10 mg capsule  Take 1 Cap by mouth three (3) times daily as needed.   Qty: 20 Cap, Refills:  0               docusate sodium (COLACE) 100 mg capsule  Take 1 Cap by mouth two (2) times a day.   Qty: 60 Cap, Refills:  0  acetaminophen (TYLENOL) 325 mg tablet  Take 2 Tabs by mouth every four (4) hours as needed.   Qty: 30 Tab, Refills:  0                     CONTINUE these medications which have CHANGED          Details        amLODIPine (NORVASC) 10 mg tablet  Take 1 Tab by mouth daily.   Qty: 30 Tab, Refills:  0               warfarin (COUMADIN) 2.5 mg tablet  Take 1 Tab by mouth daily.   Qty: 20 Tab, Refills:  0                     CONTINUE these medications which have NOT CHANGED          Details        predniSONE (DELTASONE) 5 mg tablet  Take 5 mg by mouth.               colchicine (COLCRYS) 0.6 mg tablet  Take 0.6 mg by mouth daily.               diclofenac (VOLTAREN) 1 % gel  Apply 1 g to affected area.               DULoxetine (CYMBALTA) 60 mg capsule  Take 60 mg by mouth.               levothyroxine (SYNTHROID) 88 mcg tablet  Take 88 mcg by mouth.                     STOP taking these medications                  AMLODIPINE BESYLATE, BULK,   Comments:    Reason for Stopping:                      azithromycin (ZITHROMAX) 250 mg tablet  Comments:    Reason for Stopping:                      cefUROXime (CEFTIN) 500 mg tablet  Comments:    Reason for Stopping:                      HYDROcodone-acetaminophen (NORCO) 5-325 mg per tablet  Comments:    Reason for Stopping:                          NOTIFY YOUR PHYSICIAN FOR ANY OF THE FOLLOWING:     Fever over 101 degrees for 24 hours.    Chest pain, shortness of breath, fever, chills, nausea, vomiting, diarrhea, change in mentation, falling, weakness, bleeding. Severe pain or pain not relieved by medications.   Or, any other signs or symptoms that you may have questions about.      DISPOSITION:         Home With:     OT    PT    HH    RN                 xx  Long term SNF /Inpatient Rehab       Independent/assisted living          Hospice  Other:        PATIENT CONDITION AT DISCHARGE:    Functional status         Poor      xx  Deconditioned           Independent         Cognition          Lucid      xx  Forgetful           Dementia         Catheters/lines (plus indication)         Foley        PICC        PEG         xx  None         Code status        xx  Full code           DNR         PHYSICAL EXAMINATION AT DISCHARGE:   BP 160/72    Pulse 70    Temp 98.3 ??F (36.8 ??C)    Resp 18    Ht 5\' 6"  (1.676 m)    Wt 68.5 kg (151 lb)    SpO2 94%    BMI 24.37 kg/m??       Pt in bed, no complaints of pain. She said she was not crazy - discussed that she was oriented to person, place and time but was very paranoid about her roommate talking about her, trying to conspire against her. Discussed with psychiatry this morning,  if she is calm and cooperative to go ahead and start Haldol and then have psychiatry follow up with her outpatient.      Discussed with case manager: pt to go to [facility].         Constitutional:  ??No acute distress.????     ENT:  ??Oral??MM??moist. ??     Resp:  ??No accessory muscle use??and on RA      CV:  ??Regular rhythm     ??Musculoskeletal:  ??No edema, warm, 2+ pulses throughout. Malformation of feet (nontender)     ??Neurologic:  ??Moves all extremities with good upper and lower extremity strength. Follows all  commands. Speech clear. AAOx3, CN II-XII reviewed. No focal deficits     Psych:  Calm and cooperative. Is forgetful and emotional at times. She is paranoid at times.         CHRONIC MEDICAL DIAGNOSES:      Problem List  as of 08/11/2017  Date Reviewed:  08/22/2017                        Codes  Class  Noted - Resolved             Pulmonary hypertension (HCC)  ICD-10-CM: I27.20   ICD-9-CM: 416.8    11/21/2011 - Present                       Elevated troponin  ICD-10-CM: R74.8   ICD-9-CM: 790.6    08/04/2017 - Present                       Gout  ICD-10-CM: M10.9   ICD-9-CM: 274.9    08/04/2017 - Present                       CKD (chronic kidney  disease)  ICD-10-CM: N18.9   ICD-9-CM: 585.9    08/04/2017 - Present                       Heart valve disease  ICD-10-CM: I38   ICD-9-CM: 424.90    08/04/2017 - Present                       Frequent falls  ICD-10-CM: R29.6   ICD-9-CM: V15.88    08/04/2017 - Present                       Gait instability  ICD-10-CM: R26.81   ICD-9-CM: 781.2    08/04/2017 - Present                       Anticoagulated on Coumadin  ICD-10-CM: Z51.81, Z79.01   ICD-9-CM: V58.83, V58.61    08/04/2017 - Present                       RESOLVED: Fall  ICD-10-CM: W19.Lorne Skeens   ICD-9-CM: E888.9    08/07/2017 - 08/07/2017                       * (Principal) RESOLVED: Falls  ICD-10-CM: A54.Lorne Skeens   ICD-9-CM: U981.1    08/04/2017 - 08/07/2017                       Greater than 30 minutes were spent with the patient on counseling and coordination of care      Signed:    Ernesta Amble, NP   08/11/2017   9:44 AM

## 2017-08-11 NOTE — Progress Notes (Signed)
I was able to give pt her Coumadin around 0130 this morning.  Previous nurse tried to give it on evenings but pt refused and threw pill in the bed.     During my 12 hour shift, pt was able to answer who she was, where she was, why she was there and the year.  She was also able to tell me that her husband died 7870yrs ago.  I then asked her what year he died and she was able to tell me 2002.  She was able to tell me about a letter her son got in the mail about her granddaughter about being gifted and could speak on many things that happened recently as well as in the past.  Most of the information she provided was accurate (son was in the room to verify) but then she would say her roommate was "taken into outer space and she died in the television".  At the beginning of the shift she would ask me repeatedly if I was "legitimate" and needed to make sure that everyone who came in the room was "ok".  She had to ask me or her son if they were ok or if "they had gotten to them" before she would let the staff member do anything with her.

## 2017-08-11 NOTE — Progress Notes (Signed)
Bedside shift change report given to Shelley, RN (oncoming nurse) by Dana, RN (offgoing nurse). Report included the following information SBAR, Kardex, Intake/Output and MAR.

## 2017-08-11 NOTE — Discharge Summary (Signed)
Discharge Summary     PATIENT ID: Jenny [name]  MRN: [id number]   DATE OF BIRTH: [birth year]    DATE OF ADMISSION: 08/04/2017  1:38 PM    DATE OF DISCHARGE: 08/11/2017  PRIMARY CARE PROVIDER: Maurine SimmeringAvram, Zheni, MD   ATTENDING PHYSICIAN: Reche DixonZ Khakwani, MD  DISCHARGING PROVIDER: Ernesta AmbleHolly M Priseis Cratty, NP. To contact this individual call [phone] and ask the operator to page.  If unavailable ask to be transferred the Adult Hospitalist Department.    CONSULTATIONS: ED CONSULT TO SENIOR SERVICES CASE MANAGEMENT  IP CONSULT TO PSYCHIATRY  IP CONSULT TO PSYCHIATRY  IP CONSULT TO CARDIOLOGY    ADMITTING DIAGNOSES & HOSPITAL COURSE:   Jenny [name] is a 84 yo female with PMH significant for acute gout for which she just was released from rehab for ~ 2 weeks ago, arrhythmia, valvular disease, pulmonary HTN, and GERD. Came to [facility]ED s/p fall with no LOC and found to have elevated troponin and admitted for further work up.    DISCHARGE DIAGNOSES / PLAN:      Fall:  - She??indicated to a previous provider she was??not dizzy prior to the fall, so ???hx??of this  - Has reason for falls given severe gout to feet,??she only endorsed 1 fall since she was released from rehab ~2 weeks??PTA  - Denied LOC and no pain with negative head CT  - plans for SNF today [facility]  ??  Hx??cardiac arrhythmia:  - Pacer placed ~ 9 years ago and battery changed ~ 1 year ago  -??no tachyarrhythmia on pace maker interrogation per Dr. Loa Sockso  ??  Elevated troponin (POA):  -??Pt declined stress testing   -??trop flat  - discussed with Dr. Loa Sockso (2/4 evening), could pursue stress test outpatient  ??  Severe gout:  -??PT/OT  - Continue Colchicine & low dose Prednisone??on discharge  ??  CKD, stage 3:??Cr??improved  ??  Hx??HTN:??now on 10 mg norvasc  - check daily BPs - may need additional agent  ??  Hx??Hypothyroid:??Continue Synthroid ->??TSH normal  ??  Chronic Afib:   - rate controlled  - pharmacy dosing coumadin, INR now 1.4. Discussed with pharmacy, discharge on 2.5 mg daily.    - monitor INR at SNF at least every other day and adjust coumadin as indicated  ??  Abdominal Cramping:   - difficulty passing stool 2/6, increased colace to BID  - may have bentyl prn    Paranoid Behavior:   - psychiatry saw yesterday and recommended Haldol to start, however did not order  - pt is calm and cooperative this morning and willing to take medication  - Discussed with Dr. Maxwell Marioneeba in psychiatry: go ahead and start Haldol BID and will need outpatient psychiatry follow up.     FOLLOW UP APPOINTMENTS:    Follow-up Information     Follow up With Specialties Details Why Contact Info    Maurine SimmeringAvram, Zheni, MD Internal Medicine In 1 week or facility provider [address]  [address]  [place]  [place] [id number]  [phone]      Waynetta SandySperry, Robert E, MD Cardiology In 2 weeks consider for outpatient stress testing  [address]  [facility]  [address]  [place] [id number]  [phone]      Psychiatrist  Schedule an appointment as soon as possible for a visit Would like eval as soon as able please          ADDITIONAL CARE RECOMMENDATIONS:   - check BP daily. Norvasc was increased  to 10 mg daily this admit but an additional medication may need to be added (avoiding ACEi/ARB due to CKD)    - pt can be manipulative at times - please be aware    Normally on 1 mg coumadin daily at home (this was her PTA dose, pts INR was 2.7/3.1 near admit)  2/5: was given 2.5 mg coumadin  2/6: was given 1 mg coumadin with expectation of INR to bump up  2/7: given 2.5 mg coumadin    Plan: discharge with 2.5 mg daily. Please monitor INR every other day and adjust coumadin as needed    DIET: Cardiac    ACTIVITY: PT/OT Eval and Treat    DISCHARGE MEDICATIONS:  Current Discharge Medication List      START taking these medications    Details   dicyclomine (BENTYL) 10 mg capsule Take 1 Cap by mouth three (3) times daily as needed.  Qty: 20 Cap, Refills: 0      docusate sodium (COLACE) 100 mg capsule Take 1 Cap by mouth two (2) times  a day.  Qty: 60 Cap, Refills: 0      acetaminophen (TYLENOL) 325 mg tablet Take 2 Tabs by mouth every four (4) hours as needed.  Qty: 30 Tab, Refills: 0         CONTINUE these medications which have CHANGED    Details   amLODIPine (NORVASC) 10 mg tablet Take 1 Tab by mouth daily.  Qty: 30 Tab, Refills: 0      warfarin (COUMADIN) 2.5 mg tablet Take 1 Tab by mouth daily.  Qty: 20 Tab, Refills: 0         CONTINUE these medications which have NOT CHANGED    Details   predniSONE (DELTASONE) 5 mg tablet Take 5 mg by mouth.      colchicine (COLCRYS) 0.6 mg tablet Take 0.6 mg by mouth daily.      diclofenac (VOLTAREN) 1 % gel Apply 1 g to affected area.      DULoxetine (CYMBALTA) 60 mg capsule Take 60 mg by mouth.      levothyroxine (SYNTHROID) 88 mcg tablet Take 88 mcg by mouth.         STOP taking these medications       AMLODIPINE BESYLATE, BULK, Comments:   Reason for Stopping:         azithromycin (ZITHROMAX) 250 mg tablet Comments:   Reason for Stopping:         cefUROXime (CEFTIN) 500 mg tablet Comments:   Reason for Stopping:         HYDROcodone-acetaminophen (NORCO) 5-325 mg per tablet Comments:   Reason for Stopping:             NOTIFY YOUR PHYSICIAN FOR ANY OF THE FOLLOWING:   Fever over 101 degrees for 24 hours.   Chest pain, shortness of breath, fever, chills, nausea, vomiting, diarrhea, change in mentation, falling, weakness, bleeding. Severe pain or pain not relieved by medications.  Or, any other signs or symptoms that you may have questions about.    DISPOSITION:    Home With:   OT  PT  HH  RN      xx Long term SNF/Inpatient Rehab    Independent/assisted living    Hospice    Other:     PATIENT CONDITION AT DISCHARGE:   Functional status    Poor    xx Deconditioned     Independent      Cognition  Lucid    xx Forgetful     Dementia      Catheters/lines (plus indication)    Foley     PICC     PEG    xx None      Code status    xx Full code     DNR      PHYSICAL EXAMINATION AT DISCHARGE:   BP 160/72    Pulse 70    Temp 98.3 ??F (36.8 ??C)    Resp 18    Ht 5\' 6"  (1.676 m)    Wt 68.5 kg (151 lb)    SpO2 94%    BMI 24.37 kg/m??     Pt in bed, no complaints of pain. She said she was not crazy - discussed that she was oriented to person, place and time but was very paranoid about her roommate talking about her, trying to conspire against her. Discussed with psychiatry this morning, if she is calm and cooperative to go ahead and start Haldol and then have psychiatry follow up with her outpatient.    Discussed with case manager: pt to go to [facility].    Constitutional: ??No acute distress.????   ENT: ??Oral??MM??moist. ??   Resp: ??No accessory muscle use??and on RA   CV: ??Regular rhythm   ??Musculoskeletal: ??No edema, warm, 2+ pulses throughout. Malformation of feet (nontender)   ??Neurologic: ??Moves all extremities with good upper and lower extremity strength. Follows all commands. Speech clear. AAOx3, CN II-XII reviewed. No focal deficits   Psych: Calm and cooperative. Is forgetful and emotional at times. She is paranoid at times.      CHRONIC MEDICAL DIAGNOSES:  Problem List as of 08/11/2017 Date Reviewed: 08-14-2017          Codes Class Noted - Resolved    Pulmonary hypertension (HCC) ICD-10-CM: I27.20  ICD-9-CM: 416.8  11/21/2011 - Present        Elevated troponin ICD-10-CM: R74.8  ICD-9-CM: 790.6  08/04/2017 - Present        Gout ICD-10-CM: M10.9  ICD-9-CM: 274.9  08/04/2017 - Present        CKD (chronic kidney disease) ICD-10-CM: N18.9  ICD-9-CM: 585.9  08/04/2017 - Present        Heart valve disease ICD-10-CM: I38  ICD-9-CM: 424.90  08/04/2017 - Present        Frequent falls ICD-10-CM: R29.6  ICD-9-CM: V15.88  08/04/2017 - Present        Gait instability ICD-10-CM: R26.81  ICD-9-CM: 781.2  08/04/2017 - Present        Anticoagulated on Coumadin ICD-10-CM: Z51.81, Z79.01  ICD-9-CM: V58.83, V58.61  08/04/2017 - Present        RESOLVED: Fall ICD-10-CM: W19.Lorne Skeens  ICD-9-CM: E888.9  08/07/2017 - 08/07/2017         * (Principal) RESOLVED: Falls ICD-10-CM: Z61.Lorne Skeens  ICD-9-CM: W960.4  08/04/2017 - 08/07/2017            Greater than 30 minutes were spent with the patient on counseling and coordination of care    Signed:   Ernesta Amble, NP  08/11/2017  9:44 AM

## 2017-08-11 NOTE — Progress Notes (Signed)
[facility] wants to hold admission for 1 day to see how patient does on the Haldol. Patient is going into a room with a roommate and the facility wants to make sure patient is no longer agitated. Have notified NP and son of the above change in plan. [facility] is now scheduled for 2/9 at 11 AM. This writer will F/U in AM and confirm discharge.    [phone]

## 2017-08-11 NOTE — Progress Notes (Signed)
Bedside shift change report given to Savannah (oncoming nurse) by Cathy (offgoing nurse). Report included the following information SBAR.

## 2017-08-11 NOTE — Progress Notes (Addendum)
Rounded and discussed case with NP. Have also been to bedside and spoken with patient this AM. Dr. Gilberto Better evaluated patient last night and has recommended Haldol 4m po BID but none was ordered. First does is being administered this AM.     Patient is calm and said she felt herself going "deeper and deeper into trouble" yesterday. Patient stated her son stayed with her last night and helped "pull her out". Patient calm this AM and able to discuss transition to rehab. [facility]. Patient understood she would go by ambulance. Patient still talking about her roommate going up into space then a funeral was help her on TV. Patient stated she wants her boyfriend back and proceeded to explain she [facility] this [facility] Left patient explaining would contact her son and arrange for her discharge after lunch.    [facility] 24 [facility]. [facility] lunch to allow time for assessment of how patient does on Haldol. [facility].     Have left message for son on both his home # and cell [phone]     Nursing to call report to [phone]   Care Management Interventions  PCP Verified by CM: Yes(Dr. XHarvie Bridge  Palliative Care Criteria Met (RRAT>21 & CHF Dx)?: No  Mode of Transport at Discharge: Other (see comment)(TBD- family verses BLS)  Transition of Care Consult (CM Consult): SNF([facility])  Partner SNF: Yes  Discharge Durable Medical Equipment: No  Physical Therapy Consult: Yes  Occupational Therapy Consult: Yes  Speech Therapy Consult: No  Current Support Network: Lives Alone  Confirm Follow Up Transport: Friends  Plan discussed with Pt/Family/Caregiver: Yes  Freedom of Choice Offered: Yes  VHoytProvided?: No   [facility]     [phone]

## 2017-08-11 NOTE — Progress Notes (Addendum)
Pharmacist Note ??? Warfarin Dosing  Consult provided for this 84 y.o.female to manage warfarin for Atrial Fibrillation    INR Goal: 2 - 3  Home regimen/ tablet size: 1 mg by mouth daily    Drugs that may increase INR:  None  Drugs that may decrease INR: None  Other current anticoagulants/ drugs that may increase bleeding risk: None  Risk factors: Age > 65  Daily INR ordered: YES    Recent Labs     08/11/17  0628 08/10/17  0431 08/09/17  0410   INR 1.4* 1.2* 1.3*     Date               INR                  Dose  2/01  2.7  1 mg   2/02  3.1  Held   2/03  2.3  Held   2/04  1.5  1 mg   2/05  1.3  2.5 mg  2/06  1.3  1 mg  2/07  1.2  2.5 mg  2/08  1.4  2.5 mg                                                                                Assessment/ Plan:  Will order warfarin 2.5 mg PO x 1 dose.      Pharmacy will continue to monitor daily and adjust therapy as indicated.  Please contact the pharmacist at x [phone] or [phone] for outpatient recommendations if needed.

## 2017-08-12 LAB — PROTHROMBIN TIME + INR
INR: 2.1 — ABNORMAL HIGH (ref 0.9–1.1)
Prothrombin time: 20.7 s — ABNORMAL HIGH (ref 9.0–11.1)

## 2017-08-12 MED ORDER — WARFARIN 1 MG TAB
1 mg | Freq: Once | ORAL | Status: DC
Start: 2017-08-12 — End: 2017-08-12

## 2017-08-12 MED ORDER — WARFARIN 1 MG TAB
1 mg | ORAL_TABLET | Freq: Every day | ORAL | 0 refills | Status: AC
Start: 2017-08-12 — End: ?

## 2017-08-12 MED ORDER — HALOPERIDOL 2 MG TAB
2 mg | ORAL_TABLET | Freq: Two times a day (BID) | ORAL | 0 refills | Status: AC
Start: 2017-08-12 — End: ?

## 2017-08-12 MED FILL — DULOXETINE 60 MG CAP, DELAYED RELEASE: 60 mg | ORAL | Qty: 1

## 2017-08-12 MED FILL — LEVOTHYROXINE 88 MCG TAB: 88 mcg | ORAL | Qty: 1

## 2017-08-12 MED FILL — DOK 100 MG CAPSULE: 100 mg | ORAL | Qty: 1

## 2017-08-12 MED FILL — AMLODIPINE 5 MG TAB: 5 mg | ORAL | Qty: 2

## 2017-08-12 MED FILL — COLCHICINE 0.6 MG TAB: 0.6 mg | ORAL | Qty: 1

## 2017-08-12 MED FILL — NORMAL SALINE FLUSH 0.9 % INJECTION SYRINGE: INTRAMUSCULAR | Qty: 10

## 2017-08-12 MED FILL — ACETAMINOPHEN 325 MG TABLET: 325 mg | ORAL | Qty: 2

## 2017-08-12 MED FILL — HYDROCODONE-ACETAMINOPHEN 5 MG-325 MG TAB: 5-325 mg | ORAL | Qty: 1

## 2017-08-12 MED FILL — PREDNISONE 5 MG TAB: 5 mg | ORAL | Qty: 1

## 2017-08-12 MED FILL — HALOPERIDOL 1 MG TAB: 1 mg | ORAL | Qty: 2

## 2017-08-12 NOTE — Discharge Summary (Signed)
Discharge Summary by Ernesta Amble, NP at 08/12/17 1610                Author: Ernesta Amble, NP  Service: Nurse Practitioner  Author Type: Nurse Practitioner       Filed: 08/12/17 0845  Date of Service: 08/12/17 0808  Status: Signed           Editor: Ernesta Amble, NP (Nurse Practitioner)  Cosigner: Rulon Abide, MD at 08/12/17 1142                       Discharge Summary        PATIENT ID: Jenny [name]   MRN: [id number]     DATE OF BIRTH: Jul 26, 1928      DATE OF ADMISSION: 08/04/2017  1:38 PM      DATE OF DISCHARGE:  08/11/2017   PRIMARY CARE PROVIDER:  Maurine Simmering, MD    ATTENDING PHYSICIAN: Reche Dixon, MD   DISCHARGING PROVIDER:  Ernesta Amble, NP. To contact this individual call [phone] and ask the operator to page.  If unavailable ask to be transferred the  Adult Hospitalist Department.      CONSULTATIONS: ED CONSULT TO SENIOR  SERVICES CASE MANAGEMENT   IP CONSULT TO PSYCHIATRY   IP CONSULT TO PSYCHIATRY   IP CONSULT TO CARDIOLOGY      ADMITTING DIAGNOSES & HOSPITAL  COURSE:    Jenny [name] is a 84 yo female with PMH significant for acute gout for which she just was released from rehab for ~ 2 weeks ago, arrhythmia, valvular  disease, pulmonary HTN, and GERD. Came to [facility] s/p fall with no LOC and found to have elevated troponin and admitted for further work up.        DISCHARGE DIAGNOSES / P LAN:           Fall:   - She??indicated to a previous provider she was??not dizzy prior to the fall, so ???hx??of this   - Has reason for falls given severe gout to feet,??she only endorsed 1 fall since she was released from rehab ~2 weeks??PTA   - Denied LOC and no pain with negative head CT   - plans for SNF today [facility])   ??   Hx??cardiac arrhythmia:   - Pacer placed ~ 9 years ago and battery changed ~ 1 year ago   -??no tachyarrhythmia on pace maker interrogation per Dr. Loa Socks   ??   Elevated troponin (POA):   -??Pt declined stress testing    -??trop flat   - discussed with Dr. Loa Socks (2/4 evening), could  pursue stress test outpatient   ??   Severe gout:   -??PT/OT   - Continue Colchicine & low dose Prednisone??on discharge   - no complaints of pain today   ??   CKD, stage 3:??Cr??improved   ??   Hx??HTN:??now on 10 mg norvasc   - check daily BPs - may need additional agent   ??   Hx??Hypothyroid:??Continue Synthroid ->??TSH normal   ??   Chronic Afib:    - rate controlled   - pharmacy dosing coumadin, INR now 2.1. Discussed with pharmacy, discharge on her usual home dose of 1 mg daily.    - monitor INR at SNF at least every other day for the next week for stabilization and adjust coumadin as indicated   ??   Abdominal Cramping:    - difficulty passing stool 2/6, increased colace to BID   -  may have bentyl prn   - no complaints today, last BM 2/8      Paranoid Behavior:    - psychiatry saw and recommended Haldol to start, however did not order   - pt is calm and cooperative this morning and willing to take medication   - 2/8: Discussed with Dr. Maxwell Marion in psychiatry: go ahead and start Haldol BID and will need outpatient psychiatry follow up.   - 2/9: slept well. Pt calm, cooperative.        FOLLOW UP APPOINTMENTS:       Follow-up Information               Follow up With  Specialties  Details  Why  Contact Info              Maurine Simmering, MD  Internal Medicine  In 1 week  or facility provider  [address]   [address]   [place]  [place] [address]   [phone]                 Waynetta Sandy, MD  Cardiology  In 2 weeks  consider for outpatient stress testing   [address] [facility]   [address]   [place] [address]   [phone]          Psychiatrist    Schedule an appointment as soon as possible for a visit  Would like eval as soon as able please                [facility])  Skilled Nursing Facility      [address]   [place] [address]   [phone]                 ADDITIONAL CARE RECOMMENDATIONS:    - check BP daily. Norvasc was increased to 10 mg daily this  admit but an additional medication may need to be added (avoiding ACEi/ARB due to CKD)      - pt can be manipulative at times - please be aware   - started on scheduled Haldol BID and has had calm and cooperative behavior, less paranoia today      Back on 1 mg coumadin daily (this was her PTA dose, pts INR was 2.7/3.1 near admit)   2/5: was given 2.5 mg coumadin   2/6: was given 1 mg coumadin with expectation of INR to bump up   2/7: given 2.5 mg coumadin   2/8: given 2.5 mg coumadin      todays INR jumped from 1.4->2.1      Plan: discharge with 1 mg daily. Please monitor INR every other day and adjust coumadin as needed      DIET: Cardiac      ACTIVITY: PT/OT Eval and Treat      DISCHARGE MEDICATIONS:     Current Discharge Medication List              START taking these medications          Details        haloperidol (HALDOL) 2 mg tablet  Take 1 Tab by mouth two (2) times a day.   Qty: 60 Tab, Refills:  0               dicyclomine (BENTYL) 10 mg capsule  Take 1 Cap by mouth three (3) times daily as needed.   Qty: 20 Cap, Refills:  0               docusate sodium (COLACE) 100 mg capsule  Take 1 Cap by mouth two (2) times a day.   Qty: 60 Cap, Refills:  0               acetaminophen (TYLENOL) 325 mg tablet  Take 2 Tabs by mouth every four (4) hours as needed.   Qty: 30 Tab, Refills:  0                     CONTINUE these medications which have CHANGED          Details        warfarin (COUMADIN) 1 mg tablet  Take 1 Tab by mouth daily.   Qty: 3 Tab, Refills:  0               amLODIPine (NORVASC) 10 mg tablet  Take 1 Tab by mouth daily.   Qty: 30 Tab, Refills:  0                     CONTINUE these medications which have NOT CHANGED          Details        predniSONE (DELTASONE) 5 mg tablet  Take 5 mg by mouth.               colchicine (COLCRYS) 0.6 mg tablet  Take 0.6 mg by mouth daily.               diclofenac (VOLTAREN) 1 % gel  Apply 1 g to affected area.               DULoxetine (CYMBALTA) 60 mg capsule  Take 60 mg  by mouth.               levothyroxine (SYNTHROID) 88 mcg tablet  Take 88 mcg by mouth.                     STOP taking these medications                  AMLODIPINE BESYLATE, BULK,  Comments:    Reason for Stopping:                      azithromycin (ZITHROMAX) 250 mg tablet  Comments:    Reason for Stopping:                      cefUROXime (CEFTIN) 500 mg tablet  Comments:    Reason for Stopping:                      HYDROcodone-acetaminophen (NORCO) 5-325 mg per tablet  Comments:    Reason for Stopping:                          NOTIFY YOUR PHYSICIAN FOR ANY OF THE FOLLOWING:     Fever over 101 degrees for 24 hours.    Chest pain, shortness of breath, fever, chills, nausea, vomiting, diarrhea, change in mentation, falling, weakness, bleeding. Severe pain or pain not relieved by medications.   Or, any other signs or symptoms that you may have questions about.      DISPOSITION:         Home With:     OT    PT  HH    RN                 xx  Long term SNF /Inpatient Rehab       Independent/assisted living          Hospice          Other:        PATIENT CONDITION AT DISCHARGE:    Functional status         Poor      xx  Deconditioned           Independent         Cognition          Lucid      xx  Forgetful           Dementia         Catheters/lines (plus indication)         Foley        PICC        PEG         xx  None         Code status        xx  Full code           DNR         PHYSICAL EXAMINATION AT DISCHARGE:   BP 157/73 (BP 1 Location: Right arm, BP Patient Position: At rest)    Pulse 68    Temp 97.8 ??F (36.6 ??C)    Resp 16    Ht 5\' 6"  (1.676 m)    Wt 68.5 kg  (151 lb)    SpO2 96%    BMI 24.37 kg/m??       Pt in bed sleeping, seen alongside nurse. Pt awakens easily, says she slept well overnight and [facility] Pt reports she is not scared and that she is going to be alright.          Constitutional:  ??No acute distress.????     ENT:  ??Oral??MM??moist. ??     Resp:  ??No accessory  muscle use??and on RA     CV:  ??Regular rhythm     ??Musculoskeletal:  ??No edema, warm, 2+ pulses throughout. Malformation of feet (nontender)     ??Neurologic:  ??Moves all extremities with good upper and lower extremity strength. Follows all  commands. Speech clear. AAOx3. No focal deficits     Psych:  Calm and cooperative.          CHRONIC MEDICAL DIAGNOSES:      Problem List  as of 08/12/2017  Date Reviewed:  08/10/2017                        Codes  Class  Noted - Resolved             Pulmonary hypertension (HCC)  ICD-10-CM: I27.20   ICD-9-CM: 416.8    11/21/2011 - Present                       Elevated troponin  ICD-10-CM: R74.8   ICD-9-CM: 790.6    08/04/2017 - Present                       Gout  ICD-10-CM: M10.9   ICD-9-CM: 274.9    08/04/2017 - Present  CKD (chronic kidney disease)  ICD-10-CM: N18.9   ICD-9-CM: 585.9    08/04/2017 - Present                       Heart valve disease  ICD-10-CM: I38   ICD-9-CM: 424.90    08/04/2017 - Present                       Frequent falls  ICD-10-CM: R29.6   ICD-9-CM: V15.88    08/04/2017 - Present                       Gait instability  ICD-10-CM: R26.81   ICD-9-CM: 781.2    08/04/2017 - Present                       Anticoagulated on Coumadin  ICD-10-CM: Z51.81, Z79.01   ICD-9-CM: V58.83, V58.61    08/04/2017 - Present                       RESOLVED: Fall  ICD-10-CM: W19.Lorne Skeens   ICD-9-CM: E888.9    08/07/2017 - 08/07/2017                       * (Principal) RESOLVED: Falls  ICD-10-CM: J81.Lorne Skeens   ICD-9-CM: X914.7    08/04/2017 - 08/07/2017                       Greater than 30 minutes were spent with the patient on counseling and coordination of care      Signed:    Ernesta Amble, NP   08/12/2017   9:44 AM

## 2017-08-12 NOTE — Progress Notes (Signed)
Bedside and Verbal shift change report given to Blair, RN (oncoming nurse) by Savannah, RN (offgoing nurse). Report included the following information SBAR, Kardex, ED Summary, Intake/Output, MAR and Recent Results.

## 2017-08-12 NOTE — Progress Notes (Signed)
Discharge is planned to [facility] for today. Patient started on Haldol BID yesterday morning. This Probation officer interacted with patient through the day yesterday and patient was calm and rational. No further yelling out or agitation was observed. Spoke with nurse Benjie Karvonen and NP this AM and patient has been reported as calm and rational throughout the night as well as today.     [facility] to have [facility] moved back to this afternoon to make sure patient has a bed. [facility] now scheduled for 2 PM.     Nursing to call report to [phone]    Care Management Interventions  PCP Verified by CM: Yes(Dr. Harvie Bridge)  Palliative Care Criteria Met (RRAT>21 & CHF Dx)?: No  Mode of Transport at Discharge: Other (see comment)(TBD- family verses BLS)  Transition of Care Consult (CM Consult): SNF([facility])  Partner SNF: Yes  Discharge Durable Medical Equipment: No  Physical Therapy Consult: Yes  Occupational Therapy Consult: Yes  Speech Therapy Consult: No  Current Support Network: Lives Alone  Confirm Follow Up Transport: Friends  Plan discussed with Pt/Family/Caregiver: Yes  Freedom of Choice Offered: Yes  [place] Provided?: No  Discharge Location  Discharge Placement: Skilled nursing facility

## 2017-08-12 NOTE — Progress Notes (Signed)
[facility]

## 2017-08-12 NOTE — Progress Notes (Signed)
[facility] asked for transfer at 3 PM now so room will be ready. [facility] indicated could not transfer till 4 or 4:30 PM. Son notified.

## 2017-08-12 NOTE — Progress Notes (Signed)
Pharmacist Note ??? Warfarin Dosing  Consult provided for this 84 y.o.female to manage warfarin for Atrial Fibrillation    INR Goal: 2 - 3  Home regimen/ tablet size: 1 mg by mouth daily    Drugs that may increase INR:  None  Drugs that may decrease INR: None  Other current anticoagulants/ drugs that may increase bleeding risk: None  Risk factors: Age > 65  Daily INR ordered: YES    Recent Labs     08/12/17  0533 08/11/17  0628 08/10/17  0431   INR 2.1* 1.4* 1.2*     Date               INR                  Dose  2/01  2.7  1 mg   2/02  3.1  Held   2/03  2.3  Held   2/04  1.5  1 mg   2/05  1.3  2.5 mg  2/06  1.3  1 mg  2/07  1.2  2.5 mg  2/08  1.4  2.5 mg   2/09  2.1  1 mg                                                                                Assessment/ Plan:  Will order warfarin 1 mg PO x 1 dose. Ordered just in case discharge delayed further.    Pharmacy will continue to monitor daily and adjust therapy as indicated.  Please contact the pharmacist at [phone] or [phone] for outpatient recommendations if needed.     Foye SpurlingAllison Smith, PharmD, BCPP, BCPS  Clinical Pharmacy Specialist, Behavioral Health

## 2017-08-12 NOTE — Discharge Summary (Signed)
Discharge Summary     PATIENT ID: Jenny [name]  MRN: [id number]   DATE OF BIRTH: 1929-06-05    DATE OF ADMISSION: 08/04/2017  1:38 PM    DATE OF DISCHARGE: 08/11/2017  PRIMARY CARE PROVIDER: Maurine SimmeringAvram, Zheni, MD   ATTENDING PHYSICIAN: Reche DixonZ Khakwani, MD  DISCHARGING PROVIDER: Ernesta AmbleHolly M Omari Mcmanaway, NP. To contact this individual call [phone] and ask the operator to page.  If unavailable ask to be transferred the Adult Hospitalist Department.    CONSULTATIONS: ED CONSULT TO SENIOR SERVICES CASE MANAGEMENT  IP CONSULT TO PSYCHIATRY  IP CONSULT TO PSYCHIATRY  IP CONSULT TO CARDIOLOGY    ADMITTING DIAGNOSES & HOSPITAL COURSE:   [name] is a 84 yo female with PMH significant for acute gout for which she just was released from rehab for ~ 2 weeks ago, arrhythmia, valvular disease, pulmonary HTN, and GERD. Came to [facility]ED s/p fall with no LOC and found to have elevated troponin and admitted for further work up.    DISCHARGE DIAGNOSES / PLAN:      Fall:  - She??indicated to a previous provider she was??not dizzy prior to the fall, so ???hx??of this  - Has reason for falls given severe gout to feet,??she only endorsed 1 fall since she was released from rehab ~2 weeks??PTA  - Denied LOC and no pain with negative head CT  - plans [facility] SN[facility]  ??  Hx??cardiac arrhythmia:  - Pacer placed ~ 9 years ago and battery changed ~ 1 year ago  -??no tachyarrhythmia on pace maker interrogation per Dr. Loa Sockso  ??  Elevated troponin (POA):  -??Pt declined stress testing   -??trop flat  - discussed with Dr. Loa Sockso (2/4 evening), could pursue stress test outpatient  ??  Severe gout:  -??PT/OT  - Continue Colchicine & low dose Prednisone??on discharge  - no complaints of pain today  ??  CKD, stage 3:??Cr??improved  ??  Hx??HTN:??now on 10 mg norvasc  - check daily BPs - may need additional agent  ??  Hx??Hypothyroid:??Continue Synthroid ->??TSH normal  ??  Chronic Afib:   - rate controlled  - pharmacy dosing coumadin, INR now 2.1. Discussed with pharmacy,  discharge on her usual home dose of 1 mg daily.   - monitor INR at SNF at least every other day for the next week for stabilization and adjust coumadin as indicated  ??  Abdominal Cramping:   - difficulty passing stool 2/6, increased colace to BID  - may have bentyl prn  - no complaints today, last BM 2/8    Paranoid Behavior:   - psychiatry saw and recommended Haldol to start, however did not order  - pt is calm and cooperative this morning and willing to take medication  - 2/8: Discussed with Dr. Maxwell Marioneeba in psychiatry: go ahead and start Haldol BID and will need outpatient psychiatry follow up.  - 2/9: slept well. Pt calm, cooperative.     FOLLOW UP APPOINTMENTS:    Follow-up Information     Follow up With Specialties Details Why Contact Info    Maurine SimmeringAvram, Zheni, MD Internal Medicine In 1 week or facility provider [address]  [address]  [place]  [place] [id number]  [phone]      Waynetta SandySperry, Robert E, MD Cardiology In 2 weeks consider for outpatient stress testing  [address]  [place] [facility]  [address]  [place] [id number]  [phone]      Psychiatrist  Schedule an appointment as soon  as possible for a visit Would like eval as soon as able please     [facility] AND [facility] [facility] Skilled Nursing Facility   [address]  [place] [address]  [phone]           ADDITIONAL CARE RECOMMENDATIONS:   - check BP daily. Norvasc was increased to 10 mg daily this admit but an additional medication may need to be added (avoiding ACEi/ARB due to CKD)    - pt can be manipulative at times - please be aware  - started on scheduled Haldol BID and has had calm and cooperative behavior, less paranoia today    Back on 1 mg coumadin daily (this was her PTA dose, pts INR was 2.7/3.1 near admit)  2/5: was given 2.5 mg coumadin  2/6: was given 1 mg coumadin with expectation of INR to bump up  2/7: given 2.5 mg coumadin  2/8: given 2.5 mg coumadin    todays INR jumped from 1.4->2.1     Plan: discharge with 1 mg daily. Please monitor INR every other day and adjust coumadin as needed    DIET: Cardiac    ACTIVITY: PT/OT Eval and Treat    DISCHARGE MEDICATIONS:  Current Discharge Medication List      START taking these medications    Details   haloperidol (HALDOL) 2 mg tablet Take 1 Tab by mouth two (2) times a day.  Qty: 60 Tab, Refills: 0      dicyclomine (BENTYL) 10 mg capsule Take 1 Cap by mouth three (3) times daily as needed.  Qty: 20 Cap, Refills: 0      docusate sodium (COLACE) 100 mg capsule Take 1 Cap by mouth two (2) times a day.  Qty: 60 Cap, Refills: 0      acetaminophen (TYLENOL) 325 mg tablet Take 2 Tabs by mouth every four (4) hours as needed.  Qty: 30 Tab, Refills: 0         CONTINUE these medications which have CHANGED    Details   warfarin (COUMADIN) 1 mg tablet Take 1 Tab by mouth daily.  Qty: 3 Tab, Refills: 0      amLODIPine (NORVASC) 10 mg tablet Take 1 Tab by mouth daily.  Qty: 30 Tab, Refills: 0         CONTINUE these medications which have NOT CHANGED    Details   predniSONE (DELTASONE) 5 mg tablet Take 5 mg by mouth.      colchicine (COLCRYS) 0.6 mg tablet Take 0.6 mg by mouth daily.      diclofenac (VOLTAREN) 1 % gel Apply 1 g to affected area.      DULoxetine (CYMBALTA) 60 mg capsule Take 60 mg by mouth.      levothyroxine (SYNTHROID) 88 mcg tablet Take 88 mcg by mouth.         STOP taking these medications       AMLODIPINE BESYLATE, BULK, Comments:   Reason for Stopping:         azithromycin (ZITHROMAX) 250 mg tablet Comments:   Reason for Stopping:         cefUROXime (CEFTIN) 500 mg tablet Comments:   Reason for Stopping:         HYDROcodone-acetaminophen (NORCO) 5-325 mg per tablet Comments:   Reason for Stopping:             NOTIFY YOUR PHYSICIAN FOR ANY OF THE FOLLOWING:   Fever over 101 degrees for 24 hours.  Chest pain, shortness of breath, fever, chills, nausea, vomiting, diarrhea, change in mentation, falling, weakness, bleeding. Severe pain or  pain not relieved by medications.  Or, any other signs or symptoms that you may have questions about.    DISPOSITION:    Home With:   OT  PT  HH  RN      xx Long term SNF/Inpatient Rehab    Independent/assisted living    Hospice    Other:     PATIENT CONDITION AT DISCHARGE:   Functional status    Poor    xx Deconditioned     Independent      Cognition     Lucid    xx Forgetful     Dementia      Catheters/lines (plus indication)    Foley     PICC     PEG    xx None      Code status    xx Full code     DNR      PHYSICAL EXAMINATION AT DISCHARGE:  BP 157/73 (BP 1 Location: Right arm, BP Patient Position: At rest)    Pulse 68    Temp 97.8 ??F (36.6 ??C)    Resp 16    Ht 5\' 6"  (1.676 m)    Wt 68.5 kg (151 lb)    SpO2 96%    BMI 24.37 kg/m??     Pt in bed sleeping, seen alongside nurse. Pt awakens easily, says she slept well overnight and recalls that she will be going to [facility] today. Pt reports she is not scared and that she is going to be alright.     Constitutional: ??No acute distress.????   ENT: ??Oral??MM??moist. ??   Resp: ??No accessory muscle use??and on RA   CV: ??Regular rhythm   ??Musculoskeletal: ??No edema, warm, 2+ pulses throughout. Malformation of feet (nontender)   ??Neurologic: ??Moves all extremities with good upper and lower extremity strength. Follows all commands. Speech clear. AAOx3. No focal deficits   Psych: Calm and cooperative.       CHRONIC MEDICAL DIAGNOSES:  Problem List as of 08/12/2017 Date Reviewed: 08-21-17          Codes Class Noted - Resolved    Pulmonary hypertension (HCC) ICD-10-CM: I27.20  ICD-9-CM: 416.8  11/21/2011 - Present        Elevated troponin ICD-10-CM: R74.8  ICD-9-CM: 790.6  08/04/2017 - Present        Gout ICD-10-CM: M10.9  ICD-9-CM: 274.9  08/04/2017 - Present        CKD (chronic kidney disease) ICD-10-CM: N18.9  ICD-9-CM: 585.9  08/04/2017 - Present        Heart valve disease ICD-10-CM: I38  ICD-9-CM: 424.90  08/04/2017 - Present        Frequent falls ICD-10-CM: R29.6   ICD-9-CM: V15.88  08/04/2017 - Present        Gait instability ICD-10-CM: R26.81  ICD-9-CM: 781.2  08/04/2017 - Present        Anticoagulated on Coumadin ICD-10-CM: Z51.81, Z79.01  ICD-9-CM: V58.83, V58.61  08/04/2017 - Present        RESOLVED: Fall ICD-10-CM: W19.Lorne Skeens  ICD-9-CM: E888.9  08/07/2017 - 08/07/2017        * (Principal) RESOLVED: Falls ICD-10-CM: E45.Lorne Skeens  ICD-9-CM: W098.1  08/04/2017 - 08/07/2017            Greater than 30 minutes were spent with the patient on counseling and coordination of care    Signed:   [facility]  Baldo Daub, NP  08/12/2017  9:44 AM

## 2017-08-15 ENCOUNTER — Encounter: Attending: Internal Medicine | Primary: Internal Medicine

## 2017-08-15 NOTE — Progress Notes (Signed)
History of Present Illness:   [name] is an 84 year-old Caucasian female with past medical history significant for atrial fibrillation, sick sinus syndrome, hypertension, gastroesophageal reflux disease, pulmonary hypertension.  She was brought to the hospital after a fall.  The patient was admitted to [facility] a few weeks back with acute gout attack.  She then went to rehab and returned home several days back.  She has been feeling dizzy and fell when she was trying to walk.  She was brought to the emergency room.  In the ER, she had CT head done, which did not show any acute process.  Labs showed troponin of 0.10.  She denies any chest pain, palpitations.  She was admitted to the hospital and was started on physical therapy.  She was seen by the cardiologist, Dr. Phylliss Bob.  The patient declined a stress test.  Cardiologist advised to see her as an outpatient for possible stress test.  She has atrial fibrillation, rate is under control.  During hospitalization, the patient had paranoid behavior.  Psychiatrist has seen her and it was advised she have Haldol, which has never been started.  She was not agitated anymore.  She was stabilized and was transferred to [facility]  I am seeing her in the rehab.  She is doing well right now.  She is not in any distress.      Past Medical History:   1. Atrial fibrillation.   2. Hypertension.   3. Hypothyroidism.    4. GERD.   5. Arthritis.   6. Sick sinus syndrome.   7. Pulmonary hypertension.      Past Surgical History:   Unknown.      Social History:  The patient has never been a smoker.  She drinks alcohol, seven glasses of wine per week.  She stays at home.      Allergies:  She is allergic to iodine, Morphine, Penicillin, Percocet, Percodan, Phenobarbital, Talwin, Valium.     Medications:  Haldol 2 mg tablets one tablet twice a day, Bentyl 10 mg tablets three times a day, Colace 100 mg tablets twice a day, Tylenol 325  mg tablets two tablets every four hours as needed, Coumadin 1 mg tablets once a day, Prednisone 5 mg a day, Colchicine 0.6 mg once a day, Voltaren gel 1% one gram twice a day, Cymbalta 60 mg tablets once a day, Synthroid 88 mcg once a day.      Review of Systems:  A complete review of systems was done.  Right now, essentially negative.     Physical Examination:    GENERAL:  This is a pleasant, white female not appearing in any distress.    VITALS:  T:  97.6 degrees Fahrenheit.  P:  69 per minute.  BP:  150/72 mmHg.  SaO2:  96% on room air.  Weight is 146 pounds.    HEENT:  No abnormality detected.  NECK:  Supple, no JVD, no carotid bruits, no thyromegaly.    CHEST:  Chest is clear to auscultation bilaterally.  No rales, no rhonchi.    CARDIOVASCULAR:  S1, S2 normal.  Regular rate and rhythm.   ABDOMEN:  Soft, nontender, nondistended, bowel sounds present.    EXTREMITIES:  Trace edema is present bilaterally.  Dorsalis pedis pulse normal.    NEUROLOGICAL:  Alert and oriented x3.   Cranial nerves II - XII grossly intact.  Motor is 5/5 bilaterally.  Sensory is within normal limits.  Assessment and Plan:   1. Gait weakness and frequent falls.  The patient is to continue physical therapy and occupational therapy in the rehab.  CT head was negative for any acute process.    2. Atrial fibrillation.  Rate and rhythm controlled.  The patient is taking Coumadin.  We will check PT and INR level.    3. Elevated troponin.  The patient is declining stress test.    4. CKD, stage 3.  Creatinine is at baseline.    5. Hypothyroidism.  She is on Synthroid.  TSH seems under control.    6. Hypertension.  Stable blood pressure right now.  We will continue same medications.   7. Paranoid behavior.  The patient was seen by psychiatrist and started on Haldol.  We will continue it for now.  She will follow up with psychiatrist as an outpatient.      THIS IS NOT A COMPLETE MEDICAL RECORD ON THIS PATIENT. ??   [facility]   PLEASE CONTACT THE FACILITY LISTED BELOW FOR MORE INFORMATION ON THIS PATIENT. ??  THE COMPLETE RECORD RESIDES WITH THIS LONG TERM CARE FACILITY

## 2017-08-17 NOTE — Progress Notes (Signed)
Community Care Team documentation for patient in Skilled Nursing Facility  Initial Follow Up       [facility]   Information included in this progress note has been provided to SNF.    Hospital Admission and Diagnosis: [facility] 2/1-2/9 fall      RRAT Score:  17       Advance Care Planning:  Not on file      PCP : Maurine SimmeringAvram, Zheni, MD    SNF Attending:  Mingo AmberSabina Ali, MD    Spoke with SNF team. Ensured patient arrived to SNF safely with admission packet in order.   Provided needed follow up appointments; Cardiology Waynetta SandySperry, Robert E, MD  In 2 weeks consider for outpatient stress testing  ; Psychiatrist as soon as possible. Pt is non compliant with PT/OT. Last therapy treat day scheduled for 2/20. Plan to discharge 2/21 [facility] and son's support.     Community Care Team will follow up weekly with Skilled Nursing Facility until discharge.  Medications were not reconciled and general patient assessment was not completed during this Skilled Nursing Facility outreach.

## 2017-08-18 ENCOUNTER — Encounter: Attending: Internal Medicine | Primary: Internal Medicine

## 2017-08-18 NOTE — Progress Notes (Signed)
Progress Note     Subjective:  [name] is complaining of abdominal cramps for the last few days.  She was noticed to have frequent loose stools for the last several days with odor.  She did not have any recent antibiotic treatment done.  She is uncomfortable.  She did not notice any blood in the stools.  No nausea or vomiting.  No other discomfort.   She also reports that she had arthritis in the joints for which she was receiving Hydrocodone in the past.  Right now, she is not on Hydrocodone and her joint pain is not controlled.  Otherwise, she is not too agitated.  She is eating well.  No other discomfort.      Objective:    VITALS:  T:  97.5 degrees Fahrenheit.  P:  69 per minute.  BP:  150/72 mmHg.  SaO2:  96% on room air.  Weight is 146 pounds.    HEENT:  No abnormality detected.  NECK:  Supple, no JVD, no carotid bruits, no thyromegaly.    CHEST:  Chest is clear to auscultation bilaterally.  No rales, no rhonchi.    CARDIOVASCULAR:  S1, S2 normal.  Regular rate and rhythm.   ABDOMEN:  Soft, nontender, nondistended, bowel sounds present.    EXTREMITIES:  No edema is noticed.   Dorsalis pedis pulse normal.    NEUROLOGICAL:  Alert and oriented x2.  No neurological deficits.      Laboratory Data:   Labs were reviewed.  PT and INR were done, which were therapeutic.  INR is 2.2.      Assessment and Plan:   1. Abdominal cramps with frequent loose stools.  We will check for C. difficile colitis.  She is on Bentyl 10 mg every eight hours as needed.  We will schedule Bentyl twice a day for the next three days right now.    2. Atrial fibrillation.  Rate and rhythm controlled.  She is on Coumadin.  INR is therapeutic. We will continue same dosage.    3. 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  We will schedule Tylenol 650 mg twice a day for now.    4. Gait weakness and frequent falls.  She will continue physical therapy and occupational therapy.    5. Paranoid behavior.  The patient is on Haldol.  She is doing well.          THIS IS NOT A COMPLETE MEDICAL RECORD ON THIS PATIENT. ??  [facility]   PLEASE CONTACT THE FACILITY LISTED BELOW FOR MORE INFORMATION ON THIS PATIENT. ??  THE COMPLETE RECORD RESIDES WITH THIS LONG TERM CARE FACILITY

## 2017-08-24 NOTE — Progress Notes (Signed)
Community Care Team Documentation for Patient in Skilled Nursing Facility  Discharge Note    [facility]  [facility].    PCP : Maurine SimmeringAvram, Zheni, MD    Spoke with SNF team.  [facility] did not receive [facility] services.     Community Care Team will sign off at this time.  Medications were not reconciled and general patient assessment was not completed during this skilled nursing facility outreach.

## 2017-09-01 DEATH — deceased
